# Patient Record
Sex: Male | Born: 1980 | Race: White | Hispanic: No | Marital: Married | Smoking: Never smoker
Health system: Southern US, Academic
[De-identification: ages and names within clinical notes are randomized; demographics above are authoritative.]

## PROBLEM LIST (undated history)

## (undated) DIAGNOSIS — K219 Gastro-esophageal reflux disease without esophagitis: Secondary | ICD-10-CM

## (undated) DIAGNOSIS — R000 Tachycardia, unspecified: Secondary | ICD-10-CM

## (undated) DIAGNOSIS — J309 Allergic rhinitis, unspecified: Secondary | ICD-10-CM

## (undated) DIAGNOSIS — U071 COVID-19: Secondary | ICD-10-CM

## (undated) DIAGNOSIS — F419 Anxiety disorder, unspecified: Secondary | ICD-10-CM

## (undated) DIAGNOSIS — G4719 Other hypersomnia: Secondary | ICD-10-CM

## (undated) DIAGNOSIS — I10 Essential (primary) hypertension: Secondary | ICD-10-CM

## (undated) HISTORY — PX: HX WISDOM TEETH EXTRACTION: SHX21

## (undated) HISTORY — DX: COVID-19: U07.1

## (undated) HISTORY — DX: Gastro-esophageal reflux disease without esophagitis: K21.9

## (undated) HISTORY — DX: Tachycardia, unspecified: R00.0

## (undated) HISTORY — DX: Anxiety disorder, unspecified: F41.9

## (undated) HISTORY — DX: Allergic rhinitis, unspecified: J30.9

## (undated) HISTORY — DX: Other hypersomnia: G47.19

## (undated) HISTORY — DX: Essential (primary) hypertension: I10

## (undated) HISTORY — PX: DENTAL SURGERY: SHX609

---
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
## 2021-01-31 DIAGNOSIS — G4700 Insomnia, unspecified: Secondary | ICD-10-CM | POA: Insufficient documentation

## 2021-04-14 ENCOUNTER — Ambulatory Visit (INDEPENDENT_AMBULATORY_CARE_PROVIDER_SITE_OTHER): Payer: Self-pay | Admitting: Surgery

## 2021-04-19 ENCOUNTER — Encounter (RURAL_HEALTH_CENTER): Payer: Self-pay | Admitting: INTERNAL MEDICINE

## 2021-04-19 ENCOUNTER — Encounter (HOSPITAL_COMMUNITY): Payer: Self-pay | Admitting: INTERNAL MEDICINE

## 2021-04-19 DIAGNOSIS — E559 Vitamin D deficiency, unspecified: Secondary | ICD-10-CM | POA: Insufficient documentation

## 2021-04-19 DIAGNOSIS — R739 Hyperglycemia, unspecified: Secondary | ICD-10-CM | POA: Insufficient documentation

## 2021-04-19 DIAGNOSIS — K219 Gastro-esophageal reflux disease without esophagitis: Secondary | ICD-10-CM | POA: Insufficient documentation

## 2021-04-19 DIAGNOSIS — E291 Testicular hypofunction: Secondary | ICD-10-CM

## 2021-04-19 DIAGNOSIS — E781 Pure hyperglyceridemia: Secondary | ICD-10-CM | POA: Insufficient documentation

## 2021-04-19 DIAGNOSIS — G43909 Migraine, unspecified, not intractable, without status migrainosus: Secondary | ICD-10-CM | POA: Insufficient documentation

## 2021-04-19 DIAGNOSIS — F419 Anxiety disorder, unspecified: Secondary | ICD-10-CM | POA: Insufficient documentation

## 2021-04-19 DIAGNOSIS — J9601 Acute respiratory failure with hypoxia: Secondary | ICD-10-CM | POA: Insufficient documentation

## 2021-04-19 DIAGNOSIS — I10 Essential (primary) hypertension: Secondary | ICD-10-CM | POA: Insufficient documentation

## 2021-04-19 DIAGNOSIS — D751 Secondary polycythemia: Secondary | ICD-10-CM | POA: Insufficient documentation

## 2021-04-20 ENCOUNTER — Encounter (INDEPENDENT_AMBULATORY_CARE_PROVIDER_SITE_OTHER): Payer: Self-pay | Admitting: Surgery

## 2021-04-20 ENCOUNTER — Other Ambulatory Visit: Payer: Self-pay

## 2021-04-20 ENCOUNTER — Ambulatory Visit (INDEPENDENT_AMBULATORY_CARE_PROVIDER_SITE_OTHER): Payer: Managed Care, Other (non HMO) | Admitting: Surgery

## 2021-04-20 VITALS — BP 113/79 | HR 85 | Temp 97.6°F | Resp 18 | Ht 68.0 in | Wt 373.5 lb

## 2021-04-20 DIAGNOSIS — L989 Disorder of the skin and subcutaneous tissue, unspecified: Secondary | ICD-10-CM

## 2021-04-22 ENCOUNTER — Encounter (RURAL_HEALTH_CENTER): Payer: Self-pay | Admitting: INTERNAL MEDICINE

## 2021-04-22 ENCOUNTER — Other Ambulatory Visit: Payer: Self-pay

## 2021-04-22 ENCOUNTER — Ambulatory Visit: Payer: Managed Care, Other (non HMO) | Attending: INTERNAL MEDICINE | Admitting: INTERNAL MEDICINE

## 2021-04-22 ENCOUNTER — Ambulatory Visit (RURAL_HEALTH_CENTER): Payer: Managed Care, Other (non HMO) | Attending: INTERNAL MEDICINE | Admitting: INTERNAL MEDICINE

## 2021-04-22 VITALS — BP 121/83 | HR 64 | Resp 18 | Ht 68.0 in | Wt 268.0 lb

## 2021-04-22 DIAGNOSIS — I10 Essential (primary) hypertension: Secondary | ICD-10-CM | POA: Insufficient documentation

## 2021-04-22 DIAGNOSIS — R600 Localized edema: Secondary | ICD-10-CM | POA: Insufficient documentation

## 2021-04-22 DIAGNOSIS — E538 Deficiency of other specified B group vitamins: Secondary | ICD-10-CM | POA: Insufficient documentation

## 2021-04-22 DIAGNOSIS — R0600 Dyspnea, unspecified: Secondary | ICD-10-CM | POA: Insufficient documentation

## 2021-04-22 DIAGNOSIS — R739 Hyperglycemia, unspecified: Secondary | ICD-10-CM | POA: Insufficient documentation

## 2021-04-22 DIAGNOSIS — E291 Testicular hypofunction: Secondary | ICD-10-CM | POA: Insufficient documentation

## 2021-04-22 DIAGNOSIS — F419 Anxiety disorder, unspecified: Secondary | ICD-10-CM | POA: Insufficient documentation

## 2021-04-22 DIAGNOSIS — G629 Polyneuropathy, unspecified: Secondary | ICD-10-CM | POA: Insufficient documentation

## 2021-04-22 DIAGNOSIS — E781 Pure hyperglyceridemia: Secondary | ICD-10-CM | POA: Insufficient documentation

## 2021-04-22 DIAGNOSIS — E559 Vitamin D deficiency, unspecified: Secondary | ICD-10-CM | POA: Insufficient documentation

## 2021-04-22 DIAGNOSIS — K219 Gastro-esophageal reflux disease without esophagitis: Secondary | ICD-10-CM | POA: Insufficient documentation

## 2021-04-22 DIAGNOSIS — F325 Major depressive disorder, single episode, in full remission: Secondary | ICD-10-CM | POA: Insufficient documentation

## 2021-04-22 LAB — VITAMIN B12: VITAMIN B 12: 294 pg/mL (ref 180–914)

## 2021-04-22 LAB — LIPID PANEL
CHOL/HDL RATIO: 4.5
CHOLESTEROL: 141 mg/dL (ref 136–290)
HDL CHOL: 31 mg/dL (ref 23–92)
LDL CALC: 79 mg/dL (ref 0–100)
TRIGLYCERIDES: 153 mg/dL — ABNORMAL HIGH (ref <=150)
VLDL CALC: 31 mg/dL (ref 0–50)

## 2021-04-22 LAB — CBC WITH DIFF
BASOPHIL #: 0 10*3/uL (ref 0.00–2.50)
BASOPHIL %: 0 % (ref 0–3)
EOSINOPHIL #: 0.1 10*3/uL (ref 0.00–2.40)
EOSINOPHIL %: 1 % (ref 0–7)
HCT: 43.8 % (ref 42.0–51.0)
HGB: 14.9 g/dL (ref 13.5–18.0)
LYMPHOCYTE #: 1.5 10*3/uL — ABNORMAL LOW (ref 2.10–11.00)
LYMPHOCYTE %: 21 % — ABNORMAL LOW (ref 25–45)
MCH: 29 pg (ref 27.0–32.0)
MCHC: 34.1 g/dL (ref 32.0–36.0)
MCV: 85.1 fL (ref 78.0–99.0)
MONOCYTE #: 0.6 10*3/uL (ref 0.00–4.10)
MONOCYTE %: 9 % (ref 0–12)
MPV: 8 fL (ref 7.4–10.4)
NEUTROPHIL #: 5.2 10*3/uL (ref 4.10–29.00)
NEUTROPHIL %: 69 % (ref 40–76)
PLATELETS: 258 10*3/uL (ref 140–440)
RBC: 5.15 10*6/uL (ref 4.20–6.00)
RDW: 14.7 % (ref 11.6–14.8)
WBC: 7.4 10*3/uL (ref 4.0–10.5)
WBCS UNCORRECTED: 7.4 10*3/uL

## 2021-04-22 LAB — COMPREHENSIVE METABOLIC PNL, FASTING
ALBUMIN/GLOBULIN RATIO: 1.7 — ABNORMAL HIGH (ref 0.8–1.4)
ALBUMIN: 4.2 g/dL (ref 3.5–5.7)
ALKALINE PHOSPHATASE: 74 U/L (ref 34–104)
ALT (SGPT): 36 U/L (ref 7–52)
ANION GAP: 4 mmol/L — ABNORMAL LOW (ref 10–20)
AST (SGOT): 19 U/L (ref 13–39)
BILIRUBIN TOTAL: 0.5 mg/dL (ref 0.3–1.2)
BUN/CREA RATIO: 21 (ref 6–22)
BUN: 19 mg/dL (ref 7–25)
CALCIUM, CORRECTED: 9.1 mg/dL (ref 8.9–10.8)
CALCIUM: 9.3 mg/dL (ref 8.6–10.3)
CHLORIDE: 106 mmol/L (ref 98–107)
CO2 TOTAL: 29 mmol/L (ref 21–31)
CREATININE: 0.9 mg/dL (ref 0.60–1.30)
ESTIMATED GFR: 112 mL/min/{1.73_m2} (ref >59)
GLOBULIN: 2.5 — ABNORMAL LOW (ref 2.9–5.4)
GLUCOSE: 101 mg/dL (ref 74–109)
OSMOLALITY, CALCULATED: 280 mOsm/kg (ref 270–290)
POTASSIUM: 4.8 mmol/L (ref 3.5–5.1)
PROTEIN TOTAL: 6.7 g/dL (ref 6.4–8.9)
SODIUM: 139 mmol/L (ref 136–145)

## 2021-04-22 LAB — VITAMIN D 25 TOTAL: VITAMIN D: 26 ng/mL — ABNORMAL LOW (ref 30–100)

## 2021-04-22 MED ORDER — TRAZODONE 100 MG TABLET
100.0000 mg | ORAL_TABLET | Freq: Every evening | ORAL | 3 refills | Status: DC
Start: 2021-04-22 — End: 2021-10-13

## 2021-04-22 MED ORDER — OMEPRAZOLE 40 MG CAPSULE,DELAYED RELEASE
40.0000 mg | DELAYED_RELEASE_CAPSULE | Freq: Every day | ORAL | 3 refills | Status: DC
Start: 2021-04-22 — End: 2021-10-13

## 2021-04-22 NOTE — Progress Notes (Signed)
[facility]  [address]  [place], [place]  [address]  Phone: [phone] Fax: [phone]    Name: Aaron Skinner                       Date of Birth: September 09, 1980   MRN:  [id number]                         Date of visit: 04/22/2021     Chief Complaint: Follow Up 3 Months (No new problems)    Current Outpatient Medications   Medication Sig   . hydrOXYzine pamoate (VISTARIL) 25 mg Oral Capsule Take 1 Capsule (25 mg total) by mouth Three times a day as needed for Itching   . omeprazole (PRILOSEC) 40 mg Oral Capsule, Delayed Release(E.C.) Take 1 Capsule (40 mg total) by mouth Once a day   . pediatric multivitamins Oral Tablet, Chewable Chew 1 Tablet Once a day   . rizatriptan (MAXALT) 10 mg Oral Tablet Take 1 Tablet (10 mg total) by mouth Once, as needed for Migraine May repeat in 2 hours if needed.   . traZODone (DESYREL) 100 mg Oral Tablet Take 1 Tablet (100 mg total) by mouth Every night       Patient Active Problem List    Diagnosis Date Noted   . Neuropathy (CMS HCC) 04/22/2021   . B12 deficiency 04/22/2021   . Localized edema 04/22/2021   . 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 Hyperglycemia 04/19/2021   . Migraine 04/19/2021   . Anxiety 04/19/2021   . Hypertension 04/19/2021   . Erythrocytosis 04/19/2021   . Vitamin D deficiency 04/19/2021   . Gastroesophageal reflux disease 04/19/2021   . Acute respiratory failure with hypoxia (CMS HCC) 04/19/2021       Subjective:   Patient is here for CDM visit.    Obesity  Lost 25# since last visit. (I believe 373 in vitals is probably an error)    Neuropathy  Presenting as numbness and tingling in B/L heels. Present since 12-2019.  Patient was noted to have low normal B12 by myself 06-23-20 with level 287.  Suggested for patient to pick up OTC replacement as insurance does not cover  This resolved sx. Will remove at next visit.     B12 deficiency  Causing peripheral  neuropathy  On OTC replacement  Labs on 06-23-2020 showed vitamin B12 287    Hyperglycemia  Labs on 05-2019 showed hemoglobin A1c 5.8  Labs on 06-23-2020 showed hemoglobin A1c 5.5  Labs on 01/18/2021 showed hemoglobin A1c 5.6    Edema  Not currently taking lasix    GERD  On Omeprazole  Still having symptoms    Hypertension  Was on Lisinopril  Greatly improved with the weight loss    Anxiety  Was on ativan, vistaril and buspar  Review of records note patient does not believe BuSpar works well  on vistaril, just taking PRN    Depression  On trazodone, just taking PRN  Review of records notes patient has received Wellbutrin, Lexapro, Effexor, Depakote, Abilify among others    Hypogonadism  On testosterone  I could not find where any work-up on testosterone levels was ever performed by last PCP. 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 Endo had visit, and provided simple replacement without any further evaluation.  Patient called [place]  [phone] requesting we write Rx again. Stated insurance does not cover if Endocrine prescribed.   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 Normal Echo. Normal PFTs.  Released by cardiology 10-2019  Dyspnea mostly resolved.    Vitamin D Deficiency  On replacement  Labs on 06-23-2020 showed vitamin  D 33.7    ROS:  10 systems reviewed and were negative except as noted.   + fatigue  + numbness  + insomnia, anxiety, depression    Objective :  BP 121/83 (Site: Left, Patient Position: Sitting, Cuff Size: Adult)   Pulse 64   Resp 18   Ht 1.727 m (5\' 8" )   Wt 122 kg (268 lb)   SpO2 95%   BMI 40.75 kg/m       General:  appears in good health and morbidly obese  Lungs:  Clear to auscultation bilaterally.   Cardiovascular:  regular rate and rhythm  Neurologic:  Grossly normal  Psychiatric:  Normal    Data reviewed:      Assessment/Plan:  Assessment/Plan   1. Neuropathy (CMS HCC)    2. B12 deficiency    3. Hyperglycemia    4. Localized edema    5. Gastroesophageal reflux disease, unspecified whether esophagitis present    6. Hypertension, unspecified type    7. Anxiety    8. 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)    9. Male hypogonadism    10. Pure hyperglyceridemia    11. Vitamin D deficiency      Hypertension  Monitor  Fairly good control    GERD  Continue omeprazole  behavioral changes discussed    Dyspnea  History of asthma with worsening from COVID-pneumonia  Continue inhalers    Psych  Continue Vistaril as needed    Vitamin D deficiency  Continue replacement    Edema  Strongly suspect due to patient's weight  improved    Neuropathy  Suspect 2/2 B12 deficiency  Resolved    Hyperglycemia  Monitor    Hypertriglyceridemia  Monitor  Not really high enough for medical treatment    B12 deficiency  Continue Replacement    Hypogonadism  Repeat labs  Consider Clomid instead of injection    Moderate decision making    Casimer Lanius, DO, [facility]  Internal Medicine

## 2021-04-22 NOTE — Nursing Note (Signed)
Patient is here for his follow up. Patient reports he takes all of his medications only as needed.

## 2021-04-23 ENCOUNTER — Encounter (INDEPENDENT_AMBULATORY_CARE_PROVIDER_SITE_OTHER): Payer: Self-pay | Admitting: Surgery

## 2021-04-23 LAB — HGA1C (HEMOGLOBIN A1C WITH EST AVG GLUCOSE): HEMOGLOBIN A1C: 5.2 % (ref 4.0–6.0)

## 2021-04-23 NOTE — Progress Notes (Signed)
Office History and Physical      Reason for Visit: Other (Painful "knots" posterior right upper arm)    History of Present Illness  Mr. Aaron Skinner presents as a referral by Dr. Baldomero Lamy for evaluation of a posterior right arm lesion.  It has been present for the last 3-4 weeks.  It has increased in size initially now almost completely resolved.  Etiology is unknown.  Describes possible thrombophlebitis of the area.  Seems like it was worse when he was "working out".  Is located to the posterior aspect of the triceps.  No history of similar occurrences in the past.  No complaints currently.  Asymptomatic.      I have reviewed the patient's provided medical records and diagnostic testing including laboratory values, imaging results, documented encounters and providers notes with all pertinent information noted with respect to today's evaluation serving as unique tests and sources as a component of the medical decision making process for this encounter relevant to the patients independent evaluation by me today.        Patient Data  Patient History  Past Medical History:   Diagnosis Date   . Allergic rhinitis    . Anxiety    . COVID    . Esophageal reflux    . Excessive daytime sleepiness    . Hypertension     does not have anymore   . Tachycardia, unspecified          Past Surgical History:   Procedure Laterality Date   . DENTAL SURGERY     . HX WISDOM TEETH EXTRACTION           Current Outpatient Medications   Medication Sig   . hydrOXYzine pamoate (VISTARIL) 25 mg Oral Capsule Take 1 Capsule (25 mg total) by mouth Three times a day as needed for Itching   . omeprazole (PRILOSEC) 40 mg Oral Capsule, Delayed Release(E.C.) Take 1 Capsule (40 mg total) by mouth Once a day   . pediatric multivitamins Oral Tablet, Chewable Chew 1 Tablet Once a day   . rizatriptan (MAXALT) 10 mg Oral Tablet Take 1 Tablet (10 mg total) by mouth Once, as needed for Migraine May repeat in 2 hours if needed.   . traZODone (DESYREL)  100 mg Oral Tablet Take 1 Tablet (100 mg total) by mouth Every night     Allergies   Allergen Reactions   . Clindamycin      Family Medical History:    None         Social History     Tobacco Use   . Smoking status: Never   . Smokeless tobacco: Never   Vaping Use   . Vaping Use: Never used   Substance Use Topics   . Alcohol use: Not Currently     Comment: social drinker   . Drug use: Never        The above documented section regarding past medical, past surgical, family, and social history (PMFSH) has been reviewed and considered and to the best of my knowledge represents a valid and accurate reflection of the patient's previous pertinent experiences documented by multiple providers and participants of the EMR.I cannot attest to all entries but do no recognize any gross inaccuracies as the data is a common field across all providers  Further history pertinent to the current encounter will be found as referenced       Physical Examination:    Vitals:    04/20/21 1617   BP: 113/79  Pulse: 85   Resp: 18   Temp: 36.4 C (97.6 F)   SpO2: 93%   Weight: (!) 169 kg (373 lb 8 oz)   Height: 1.727 m (5\' 8" )   BMI: 56.91      General: appropriate for age. in no acute distress.    HEENT: Atraumatic, Normocephalic.    Lungs: Nonlabored breathing with symmetric expansion    Heart:Regular wth respect to rate and rythmn.    Abdomen:Soft. Nontender. Nondistended     Psychiatric: Alert and oriented to person, place, and time. affect appropriate    Skin: No rashes or obvious skin lesions .  No significant posterior arm subcutaneous lesion on exam today        Diagnosis:    ICD-10-CM    1. Lesion of subcutaneous tissue  L98.9           Plan:    Mr. Defelice has had essentially complete resolution of the subcutaneous nodule which was causing him discomfort previously.  No palpable mass.  No obvious area consistent with thrombophlebitis today.  Discussed options to include observation versus consideration of exploration versus ultrasound.   Given his current lack of symptoms as well as essentially resolution of the lesion he would like to avoid any further workup.  Follow up here as needed     This note may have been partially generated using MModal Fluency Direct system, and there may be some incorrect words, spellings, and punctuation that were not noted in checking the note before saving, though effort was made to avoid such errors.    Damian Leavell MD FACS RVT  [facility] -General Surgery

## 2021-04-26 LAB — LDL CHOLESTEROL, DIRECT: LDL DIRECT: 104 mg/dL — ABNORMAL HIGH (ref <100)

## 2021-05-01 LAB — TESTOSTERONE FREE (DIALYSIS) AND TOTAL,MS
TESTOSTERONE, FREE: 37.1 pg/mL (ref 35.0–155.0)
TESTOSTERONE,TOTAL,LC/MS/MS: 171 ng/dL — ABNORMAL LOW (ref 250–1100)

## 2021-05-02 ENCOUNTER — Telehealth (RURAL_HEALTH_CENTER): Payer: Self-pay | Admitting: INTERNAL MEDICINE

## 2021-05-02 MED ORDER — CLOMIPHENE CITRATE 50 MG TABLET
50.0000 mg | ORAL_TABLET | Freq: Every day | ORAL | 0 refills | Status: AC
Start: 2021-05-02 — End: 2021-07-31

## 2021-05-02 NOTE — Telephone Encounter (Signed)
Patient was notified of results he wants to know if you were going to send in any thing for Testerone or if you are gonna wait to see if he brings it up more

## 2021-05-02 NOTE — Telephone Encounter (Signed)
-----   Message from Terald Sleeper, DO sent at 05/02/2021  7:55 AM EDT -----  Testosterone finally back. Its higher than December, but still not in normal range.   B12 is low normal. May be worth OTC replacement.   Other labs all look ok  A1c was good at 5.2

## 2021-05-02 NOTE — Telephone Encounter (Signed)
Left message for pt to call regarding labs

## 2021-05-02 NOTE — Telephone Encounter (Signed)
Will try the Clomid since he didn't seem very excited about resuming the injections

## 2021-10-12 ENCOUNTER — Other Ambulatory Visit: Payer: Self-pay

## 2021-10-12 ENCOUNTER — Encounter (HOSPITAL_COMMUNITY): Payer: Self-pay | Admitting: Emergency Medicine

## 2021-10-12 ENCOUNTER — Emergency Department
Admission: EM | Admit: 2021-10-12 | Discharge: 2021-10-13 | Disposition: A | Discharge status: Home / Self Care | Payer: Managed Care, Other (non HMO) | Attending: Emergency Medicine | Admitting: Emergency Medicine

## 2021-10-12 DIAGNOSIS — R9431 Abnormal electrocardiogram [ECG] [EKG]: Secondary | ICD-10-CM | POA: Insufficient documentation

## 2021-10-12 DIAGNOSIS — I493 Ventricular premature depolarization: Secondary | ICD-10-CM | POA: Insufficient documentation

## 2021-10-12 LAB — CBC WITH DIFF
BASOPHIL #: 0 10*3/uL (ref 0.00–0.10)
BASOPHIL %: 1 % (ref 0–1)
EOSINOPHIL #: 0.1 10*3/uL (ref 0.00–0.50)
EOSINOPHIL %: 1 %
HCT: 46.5 % (ref 36.7–47.1)
HGB: 16 g/dL (ref 12.5–16.3)
LYMPHOCYTE #: 1.8 10*3/uL (ref 1.00–3.00)
LYMPHOCYTE %: 21 % (ref 16–44)
MCH: 29.6 pg (ref 23.8–33.4)
MCHC: 34.4 g/dL (ref 32.5–36.3)
MCV: 86.1 fL (ref 73.0–96.2)
MONOCYTE #: 0.7 10*3/uL (ref 0.30–1.00)
MONOCYTE %: 9 % (ref 5–13)
MPV: 8.5 fL (ref 7.4–11.4)
NEUTROPHIL #: 6 10*3/uL (ref 1.85–7.80)
NEUTROPHIL %: 69 % (ref 43–77)
PLATELETS: 267 10*3/uL (ref 140–440)
RBC: 5.4 10*6/uL (ref 4.06–5.63)
RDW: 14.3 % (ref 12.1–16.2)
WBC: 8.6 10*3/uL (ref 3.6–10.2)

## 2021-10-12 LAB — COMPREHENSIVE METABOLIC PANEL, NON-FASTING
ALBUMIN/GLOBULIN RATIO: 2.2 — ABNORMAL HIGH (ref 0.8–1.4)
ALBUMIN: 4.2 g/dL (ref 3.5–5.7)
ALKALINE PHOSPHATASE: 64 U/L (ref 34–104)
ALT (SGPT): 20 U/L (ref 7–52)
ANION GAP: 10 mmol/L (ref 4–13)
AST (SGOT): 17 U/L (ref 13–39)
BILIRUBIN TOTAL: 0.3 mg/dL (ref 0.3–1.2)
BUN/CREA RATIO: 20 (ref 6–22)
BUN: 21 mg/dL (ref 7–25)
CALCIUM, CORRECTED: 9.1 mg/dL (ref 8.9–10.8)
CALCIUM: 9.3 mg/dL (ref 8.6–10.3)
CHLORIDE: 106 mmol/L (ref 98–107)
CO2 TOTAL: 23 mmol/L (ref 21–31)
CREATININE: 1.07 mg/dL (ref 0.60–1.30)
ESTIMATED GFR: 91 mL/min/{1.73_m2} (ref >59)
GLOBULIN: 1.9 — ABNORMAL LOW (ref 2.9–5.4)
GLUCOSE: 94 mg/dL (ref 74–109)
OSMOLALITY, CALCULATED: 280 mOsm/kg (ref 270–290)
POTASSIUM: 3.8 mmol/L (ref 3.5–5.1)
PROTEIN TOTAL: 6.1 g/dL — ABNORMAL LOW (ref 6.4–8.9)
SODIUM: 139 mmol/L (ref 136–145)

## 2021-10-12 LAB — ECG 12 LEAD
Atrial Rate: 72 {beats}/min
Calculated P Axis: 6 degrees
Calculated R Axis: 61 degrees
Calculated T Axis: 27 degrees
PR Interval: 178 ms
QRS Duration: 88 ms
QT Interval: 398 ms
QTC Calculation: 435 ms
Ventricular rate: 72 {beats}/min

## 2021-10-12 LAB — TROPONIN-I
TROPONIN I: 3 ng/L (ref <20)
TROPONIN I: 3 ng/L (ref <20)

## 2021-10-12 LAB — THYROID STIMULATING HORMONE (SENSITIVE TSH): TSH: 2.389 u[IU]/mL (ref 0.450–5.330)

## 2021-10-12 NOTE — ED Triage Notes (Signed)
HEART PALPITATIONS x 4 DAYS. HAS TRIED TO LIMIT CAFFEINE AND CREATINE AND CELCIUS ENERGY DRINKS SINCE ONSET. NO KNOWN CARDIAC HX. STATES HEART RATE IS ELEVATED MORE AT NIGHT WHILE TRYING TO SLEEP.

## 2021-10-12 NOTE — ED Provider Notes (Signed)
[facility]  ED Primary Provider Note  History of Present Illness   chief complaint    Arrival: The patient arrived by Car         Aaron Skinner is a 41 y.o. male who had concerns including Palpitations.  Patient is a 41 year old male presents emergency room with a chief complaint of "palpitations".  Patient states that "I feel like my heart is skipping a be every few minutes".  He states he is not having any chest pain.  However "when my heart skips I feel like I have to take a breath".  Patient states he went to [place] [facility] who in turn sent him here to the emergency room.  Patient states he had been drinking energy drinks but it stopped him for the past several days.  In addition he is cut back on taken creatinine.  He states he is lost 85 lb over the past several months.  He denies any nicotine.  All nursing notes reviewed        Review of Systems     No other overt Review of Systems are noted to be positive except noted in the HPI.      Historical Data   History Reviewed This Encounter: Medical History  Surgical History  Family History  Social History      Physical Exam   ED Triage Vitals [10/12/21 2103]   BP (Non-Invasive) 136/77   Heart Rate 78   Respiratory Rate 18   Temperature 36.6 C (97.8 F)   SpO2 98 %   Weight 108 kg (238 lb)   Height 1.753 m ('5\' 9"'$ )         Exam:   Constitutional:  Patient alert orient x3 in no apparent distress.  No limitations.  Overweight  Head: Atraumatic normocephalic  Eyes :  Pupils are equal round reactive to light and accommodation extraocular muscles are intact.  Sclera and conjunctiva are unremarkable  Ears:  Tympanic membranes are pearly gray bilaterally; external auditory canals are unremarkable; external ears without any lesions  Nose:  Nares are patent turbinates are pink and moist  Mouth:  Mucosa is pink and moist without lesions.  Posterior pharynx is pink and moist without hypertrophy/exudate.  Neck:  Soft and supple without  palpable lymphadenopathy.  Heart:  Regular rate and rhythm without audible murmur  Lungs:  Clear to auscultation bilaterally without any wheezing/rales/rhonchi  Abdomen:  Soft nontender without any rebound or guarding; positive bowel sounds throughout  Genitalia:  Deferred  Skin:  Warm and dry without lesions.  Normal skin turgor.  Brisk capillary refill distally  Extremities:  Good strenght bilaterally with full range of motion of upper and lower extremities.  Neuro:  Alert oriented x3.  Cranial nerves II-XII grossly intact as tested.  Excellent sensation distally over all dermatomes.  Psychiatric:  Patient cooperative, affect appropriate, insight and judgment good        Procedures      Patient Data     Labs Ordered/Reviewed   COMPREHENSIVE METABOLIC PANEL, NON-FASTING - Abnormal; Notable for the following components:       Result Value    PROTEIN TOTAL 6.1 (*)     ALBUMIN/GLOBULIN RATIO 2.2 (*)     GLOBULIN 1.9 (*)     All other components within normal limits    Narrative:     Estimated Glomerular Filtration Rate (eGFR) is calculated using the CKD-EPI (2021) equation, intended for patients 52 years of age and  older. If gender is not documented or "unknown", there will be no eGFR calculation.   TROPONIN-I - Normal   THYROID STIMULATING HORMONE (SENSITIVE TSH) - Normal   CBC WITH DIFF - Normal   CBC/DIFF    Narrative:     The following orders were created for panel order CBC/DIFF.  Procedure                               Abnormality         Status                     ---------                               -----------         ------                     CBC WITH DIFF[[id number]]                Normal              Final result                 Please view results for these tests on the individual orders.   TROPONIN-I   TROPONIN-I       No orders to display       Medical Decision Making          Medical Decision Making  Patient has been on telemetry he is had a rare PVC.  EKG was unremarkable.  Laboratory workup was  also unremarkable.  Normal CBC/CMP/TSH/troponin.  Patient be discharged home.  See discharge instructions for detailed    Amount and/or Complexity of Data Reviewed  Labs: ordered. Decision-making details documented in ED Course.  ECG/medicine tests: ordered and independent interpretation performed. Decision-making details documented in ED Course.    Critical Care  Total time providing critical care: 0 minutes      ED Course as of 10/12/21 2305   Wed Oct 12, 2021   2148 EKG shows normal sinus rhythm heart rate 72, normal axis, normal R-wave progression, no ectopy, no ischemia, normal PR/QRS interval.   2302 CBC, CMP, TSH, troponin are all negative.   2303 On telemetry patient has had a rare PVC.              Following the history, physical exam, and ED workup, the patient was deemed stable and suitable for discharge. The patient/caregiver was advised to return to the ED for any new or worsening symptoms. Discharge medications, and follow-up instructions were discussed with the patient/caregiver in detail, who verbalizes understanding. The patient/caregiver is in agreement and is comfortable with the plan of care.    Disposition: Discharged         Current Discharge Medication List        CONTINUE these medications - NO CHANGES were made during your visit.        Details   hydrOXYzine pamoate 25 mg Capsule  Commonly known as: VISTARIL   25 mg, Oral, 3 TIMES DAILY PRN  Refills: 0     omeprazole 40 mg Capsule, Delayed Release(E.C.)  Commonly known as: PRILOSEC   40 mg, Oral, DAILY  Qty: 90 Capsule  Refills: 3     pediatric multivitamins Tablet, Chewable   1 Tablet, Oral, DAILY  Refills: 0     rizatriptan 10 mg Tablet  Commonly known as: MAXALT   10 mg, Oral, ONCE PRN, May repeat in 2 hours if needed.  Refills: 0     traZODone 100 mg Tablet  Commonly known as: DESYREL   100 mg, Oral, NIGHTLY  Qty: 90 Tablet  Refills: 3            Follow up:   Casimer Lanius, DO  [address]  [place]  [id number]  [phone]    In 3 days  Follow-up with primary care provider for recheck in 2-3 days                 Clinical Impression   PVC's (premature ventricular contractions) (Primary)         Current Discharge Medication List          R.A. Baldwin [place], DO  Department of Emergency Medicine

## 2021-10-12 NOTE — ED APP Handoff Note (Signed)
[facility]  Emergency Department  Provider in Triage Note    Name: Aaron Skinner  Age: 41 y.o.  Gender: male     Subjective:   Aaron Skinner is a 41 y.o. male who presents with complaint of Palpitations  .  Palpitations worse at night 4 days, sob minor    Objective:   There were no vitals filed for this visit.   Focused Physical Exam shows palpitations    Assessment:  A medical screening exam was completed.  This patient is a 41 y.o. male with initial findings showing palpitations       Plan:  Please see initial orders and work-up below.  This is to be continued with full evaluation in the main Emergency Department.     No current facility-administered medications for this encounter.     No results found for this or any previous visit (from the past 24 hour(s)).     Lynann Beaver Labrandon Knoch, NP-C  10/12/2021, 21:02

## 2021-10-12 NOTE — Discharge Instructions (Signed)
Continue to avoid caffeine and creatinine.  Be sure to get plenty of rest  Follow-up with family doctor for recheck in 2-3 days  Return to emergency room for any chest pain, shortness of breath, or any concerns

## 2021-10-13 ENCOUNTER — Encounter (RURAL_HEALTH_CENTER): Payer: Self-pay | Admitting: INTERNAL MEDICINE

## 2021-10-13 ENCOUNTER — Other Ambulatory Visit: Payer: Self-pay

## 2021-10-13 ENCOUNTER — Ambulatory Visit (RURAL_HEALTH_CENTER): Payer: Managed Care, Other (non HMO) | Attending: INTERNAL MEDICINE | Admitting: INTERNAL MEDICINE

## 2021-10-13 VITALS — BP 114/67 | HR 73 | Resp 18 | Ht 69.0 in | Wt 238.0 lb

## 2021-10-13 DIAGNOSIS — R002 Palpitations: Secondary | ICD-10-CM | POA: Insufficient documentation

## 2021-10-13 DIAGNOSIS — I493 Ventricular premature depolarization: Secondary | ICD-10-CM | POA: Insufficient documentation

## 2021-10-13 MED ORDER — OMEPRAZOLE 40 MG CAPSULE,DELAYED RELEASE
40.0000 mg | DELAYED_RELEASE_CAPSULE | Freq: Every day | ORAL | 3 refills | Status: DC
Start: 2021-10-13 — End: 2021-12-30

## 2021-10-13 NOTE — Progress Notes (Signed)
[name]  [address]  [place], [place]  [address]  Phone: [phone] Fax: [phone]    Name: Aaron Skinner                       Date of Birth: May 15, 1980   MRN:  [id number]                         Date of visit: 10/13/2021     Chief Complaint: ED Follow-up (Heart palpitations)    Current Outpatient Medications   Medication Sig    omeprazole (PRILOSEC) 40 mg Oral Capsule, Delayed Release(E.C.) Take 1 Capsule (40 mg total) by mouth Once a day    pediatric multivitamins Oral Tablet, Chewable Chew 1 Tablet Once a day    rizatriptan (MAXALT) 10 mg Oral Tablet Take 1 Tablet (10 mg total) by mouth Once, as needed for Migraine May repeat in 2 hours if needed.       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     10-12-21  [name]  Dx: Palpitations, PVCs    Patient is a 41 year old male that presents for a follow up post-emergency room visit for palpitations that started on Saturday. Patient noted last night in the ED and today that his heart was "skipping every few minutes." Patient also notes that every few minutes he can "feel his pulse in his head." Patient states that these episodes are different from anxiety attacks that he was previously treated for with Buspirone. Yesterday afternoon patient was evaluated by [facility] on Wednesday who sent him to the ED.    Findings from ED:   EKG shows normal sinus rhythm heart rate 72, normal axis, normal R-wave  progression, no ectopy, no ischemia, normal PR/QRS interval.   CBC, CMP, TSH, troponin levels were all negative   On telemetry patient had rare PVC.     ED recommended patient follow up with Primary Care in 2-3 days.    Patient has been on a weight loss and body building journey for the past several months. Patient notes that he normally takes [employer] creatine powder and drinks a [employer] Drink as a pre-work out. Denies other pre-work out powders. States that recently he had to switch creatine powder to a new brand, [employer] signature creatine powder. Patient also notes taking [employer] BCAA supplementation. Patient's diet includes adequate animal, dairy protein, and vegetables. Patient admits to low water intake. Patient only drinks approximately 2 bottles of water a day (total 16 fluid oz). Notes that he does prefer seltzer waters.     Of other note: patient has stopped his trazodone and vistaril.  Dr Michelle Nasuti has him on Rybelsus for glucose and weight loss  and has again been providing his Testosterone. So long as patient received from  [employer] at clinic, patient does not have copay.     ROS:  10 systems reviewed and were negative except as noted.     Objective :  BP 114/67 (Site: Left, Patient Position: Sitting, Cuff Size: Adult)   Pulse 73   Resp 18   Ht 1.753 m (5\' 9" )   Wt 108 kg (238 lb)   SpO2 96%   BMI 35.15 kg/m       General:  appears in good health  Lungs:  Clear to auscultation bilaterally.   Cardiovascular:     tachycardia  Neurologic:  Grossly normal  Psychiatric:  Normal    Data reviewed:      Assessment/Plan:  Assessment/Plan   1. Palpitations    2. PVC (premature ventricular contraction)      Suspect primary etiology is new creatine powder.  Advised patient to dispose of new brand. Counseling provided to patient regarding proper use of creatine powder. Advised patient to not take the creatine powder and energy drinks at the same time. Discussed caffeine limits. Advised patient to  avoid use of any pre-workout supplementation. Discussed continuing following healthy diet and current workout regimen.     If symptoms continue to occur, will refer to Cardiology. Patient would prefer not to return to Dr .   [name], Aaron Skinner, [name]  [name]     History and findings independently assessed and verified by attending.     Written and Signed by  Aaron Skinner, Aaron Skinner  Medical Student, Aaron Skinner     [facility], Aaron Skinner  [name]

## 2021-10-13 NOTE — Nursing Note (Signed)
Patient is here with c/o palpitations  every couple of minutes all through the day. Patient denies any pain with the episodes but causes a " pulse " sensation in his head. Patient states he has no other symptoms.

## 2021-10-14 ENCOUNTER — Other Ambulatory Visit (RURAL_HEALTH_CENTER): Payer: Self-pay | Admitting: INTERNAL MEDICINE

## 2021-10-14 ENCOUNTER — Telehealth (RURAL_HEALTH_CENTER): Payer: Self-pay | Admitting: INTERNAL MEDICINE

## 2021-10-14 DIAGNOSIS — R002 Palpitations: Secondary | ICD-10-CM

## 2021-10-14 NOTE — Telephone Encounter (Signed)
Patient called with questions regarding his troponin level that was taken when he was at the ER. Patient had questions as to whether the 3 was on the high end because he said it was suppose to be under 4.   Per Dr. Allen Norris: Patient was informed that the level was normal, but if he was concerned, we could refer him to cardiology for further work up.  Patient said that he would like the referral to cardiology with Dr. Elesa Massed.

## 2021-12-07 ENCOUNTER — Ambulatory Visit: Payer: Managed Care, Other (non HMO) | Attending: INTERNAL MEDICINE | Admitting: INTERNAL MEDICINE

## 2021-12-07 ENCOUNTER — Ambulatory Visit (RURAL_HEALTH_CENTER): Payer: Managed Care, Other (non HMO) | Attending: INTERNAL MEDICINE | Admitting: INTERNAL MEDICINE

## 2021-12-07 ENCOUNTER — Encounter (RURAL_HEALTH_CENTER): Payer: Self-pay | Admitting: INTERNAL MEDICINE

## 2021-12-07 ENCOUNTER — Other Ambulatory Visit: Payer: Self-pay

## 2021-12-07 VITALS — HR 81 | Temp 100.0°F | Resp 20

## 2021-12-07 DIAGNOSIS — J069 Acute upper respiratory infection, unspecified: Secondary | ICD-10-CM | POA: Insufficient documentation

## 2021-12-07 DIAGNOSIS — Z20822 Contact with and (suspected) exposure to covid-19: Secondary | ICD-10-CM | POA: Insufficient documentation

## 2021-12-07 LAB — POCT RAPID COVID-19 & FLU (AMB ONLY)
COVID-19 AG: NEGATIVE
INFLUENZA TYPE A: NEGATIVE
INFLUENZA TYPE B: NEGATIVE

## 2021-12-07 LAB — COVID-19 ~~LOC~~ MOLECULAR LAB TESTING
INFLUENZA VIRUS TYPE A: NOT DETECTED
INFLUENZA VIRUS TYPE B: NOT DETECTED
RESPIRATORY SYNCTIAL VIRUS (RSV): DETECTED — AB
SARS-CoV-2: NOT DETECTED

## 2021-12-07 MED ORDER — METHYLPREDNISOLONE 4 MG TABLETS IN A DOSE PACK
ORAL_TABLET | ORAL | 0 refills | Status: DC
Start: 2021-12-07 — End: 2021-12-30

## 2021-12-07 MED ORDER — BENZONATATE 100 MG CAPSULE
100.0000 mg | ORAL_CAPSULE | Freq: Three times a day (TID) | ORAL | 0 refills | Status: DC
Start: 2021-12-07 — End: 2021-12-30

## 2021-12-07 NOTE — Progress Notes (Signed)
[facility]  [address]  [place], [place]  [address]  Phone: [phone] Fax: [phone]    Name: Aaron Skinner                       Date of Birth: Aug 13, 1980   MRN:  [id number]                         Date of visit: 12/07/2021     Chief Complaint: Feel Sick (C/o sore throat, cough with greenish phlegm, body aches x 3 days)    Current Outpatient Medications   Medication Sig    omeprazole (PRILOSEC) 40 mg Oral Capsule, Delayed Release(E.C.) Take 1 Capsule (40 mg total) by mouth Once a day    pediatric multivitamins Oral Tablet, Chewable Chew 1 Tablet Once a day    rizatriptan (MAXALT) 10 mg Oral Tablet Take 1 Tablet (10 mg total) by mouth Once, as needed for Migraine May repeat in 2 hours if needed.       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     Complaining of sore throat, sinus congestion, drainage, cough (clear to white) and body aches. Ongoing 3 days. Denies fever or chills.   Did contact telemedicine who provided him with Augmentin 875, which he has been taking without benefit.     ROS:  10 systems reviewed and were negative except as noted.     Objective :  Pulse 81   Temp 37.8 C (100 F) (Tympanic)   Resp 20   SpO2 94%       General:  appears in good health  Lungs:  Clear to auscultation bilaterally.   Cardiovascular:  regular rate and rhythm  Neurologic:  Grossly normal  Psychiatric:  Normal  Nasal mucosa edematous with clear rhinorrhea  Some  post nasal drainage    Data reviewed:      Assessment/Plan:  Assessment/Plan   1. Upper respiratory tract infection, unspecified type      COVID negative  Influenza negative  Center Score 0, 1%-2.5% chance of strep, no Abx or further testing recommended    Patient almost definitely has a viral illness. Abx will be of no benefit.  Supportive care recommended. Will send in cough medication.  Will send in steroids to see if we can improve symptoms  Otherwise push fluids and increase vitamin C.  Stop taking Abx.     Terald Sleeper, DO, [facility]  Internal Medicine

## 2021-12-07 NOTE — Nursing Note (Signed)
12/07/21 0800   Required: Location Test Performed At:   [facility], [address] [place] [address]   Rapid Flu   Time Performed [phone]   Rapid Flu A Result Negative   Rapid Flu B Result Negative   Initials TD   Rapid COVID negative.Marland KitchenMarland KitchenPCR was sent   Influenza Culture Sent Yes   [place]

## 2021-12-07 NOTE — Nursing Note (Signed)
Patient is here with c/o cough with greenish phlegm, sore throat, and body aches x 3 days. Patient denied any fever. Patient said that he went through "tele-doc" Sunday night and is taking Amoxicillin 850mg  BID x 7days.

## 2021-12-08 ENCOUNTER — Telehealth (RURAL_HEALTH_CENTER): Payer: Self-pay | Admitting: INTERNAL MEDICINE

## 2021-12-08 NOTE — Telephone Encounter (Signed)
Patient was notified of his results.

## 2021-12-08 NOTE — Telephone Encounter (Signed)
-----   Message from Casimer Lanius, DO sent at 12/08/2021  7:09 AM EST -----  We now know what Virus he has.  The panel at [facility] picked up RSV.  There is no specific treatment for it beyond what we have already given him. He just may want to watch exposure to others since it is pretty contagious.

## 2021-12-25 ENCOUNTER — Encounter (HOSPITAL_COMMUNITY): Payer: Self-pay

## 2021-12-25 ENCOUNTER — Emergency Department
Admission: EM | Admit: 2021-12-25 | Discharge: 2021-12-25 | Disposition: A | Discharge status: Home / Self Care | Payer: Managed Care, Other (non HMO) | Attending: Student in an Organized Health Care Education/Training Program | Admitting: Student in an Organized Health Care Education/Training Program

## 2021-12-25 ENCOUNTER — Other Ambulatory Visit: Payer: Self-pay

## 2021-12-25 ENCOUNTER — Inpatient Hospital Stay (HOSPITAL_COMMUNITY): Payer: Managed Care, Other (non HMO)

## 2021-12-25 DIAGNOSIS — Z711 Person with feared health complaint in whom no diagnosis is made: Secondary | ICD-10-CM | POA: Insufficient documentation

## 2021-12-25 DIAGNOSIS — F419 Anxiety disorder, unspecified: Secondary | ICD-10-CM | POA: Insufficient documentation

## 2021-12-25 DIAGNOSIS — J449 Chronic obstructive pulmonary disease, unspecified: Secondary | ICD-10-CM | POA: Insufficient documentation

## 2021-12-25 DIAGNOSIS — I10 Essential (primary) hypertension: Secondary | ICD-10-CM | POA: Insufficient documentation

## 2021-12-25 LAB — CBC WITH DIFF
BASOPHIL #: 0 10*3/uL (ref 0.00–0.10)
BASOPHIL %: 0 % (ref 0–1)
EOSINOPHIL #: 0.1 10*3/uL (ref 0.00–0.50)
EOSINOPHIL %: 2 %
HCT: 47.6 % — ABNORMAL HIGH (ref 36.7–47.1)
HGB: 16.2 g/dL (ref 12.5–16.3)
LYMPHOCYTE #: 1.4 10*3/uL (ref 1.00–3.00)
LYMPHOCYTE %: 21 % (ref 16–44)
MCH: 28.8 pg (ref 23.8–33.4)
MCHC: 34.1 g/dL (ref 32.5–36.3)
MCV: 84.2 fL (ref 73.0–96.2)
MONOCYTE #: 0.7 10*3/uL (ref 0.30–1.00)
MONOCYTE %: 11 % (ref 5–13)
MPV: 7.7 fL (ref 7.4–11.4)
NEUTROPHIL #: 4.4 10*3/uL (ref 1.85–7.80)
NEUTROPHIL %: 66 % (ref 43–77)
PLATELETS: 244 10*3/uL (ref 140–440)
RBC: 5.65 10*6/uL — ABNORMAL HIGH (ref 4.06–5.63)
RDW: 15.6 % (ref 12.1–16.2)
WBC: 6.8 10*3/uL (ref 3.6–10.2)

## 2021-12-25 LAB — COMPREHENSIVE METABOLIC PANEL, NON-FASTING
ALBUMIN/GLOBULIN RATIO: 1.7 — ABNORMAL HIGH (ref 0.8–1.4)
ALBUMIN: 4.4 g/dL (ref 3.5–5.7)
ALKALINE PHOSPHATASE: 60 U/L (ref 34–104)
ALT (SGPT): 29 U/L (ref 7–52)
ANION GAP: 8 mmol/L (ref 4–13)
AST (SGOT): 24 U/L (ref 13–39)
BILIRUBIN TOTAL: 0.6 mg/dL (ref 0.3–1.2)
BUN/CREA RATIO: 13 (ref 6–22)
BUN: 13 mg/dL (ref 7–25)
CALCIUM, CORRECTED: 8.6 mg/dL — ABNORMAL LOW (ref 8.9–10.8)
CALCIUM: 8.9 mg/dL (ref 8.6–10.3)
CHLORIDE: 104 mmol/L (ref 98–107)
CO2 TOTAL: 28 mmol/L (ref 21–31)
CREATININE: 1.03 mg/dL (ref 0.60–1.30)
ESTIMATED GFR: 95 mL/min/{1.73_m2} (ref >59)
GLOBULIN: 2.6 — ABNORMAL LOW (ref 2.9–5.4)
GLUCOSE: 98 mg/dL (ref 74–109)
OSMOLALITY, CALCULATED: 280 mOsm/kg (ref 270–290)
POTASSIUM: 3.9 mmol/L (ref 3.5–5.1)
PROTEIN TOTAL: 7 g/dL (ref 6.4–8.9)
SODIUM: 140 mmol/L (ref 136–145)

## 2021-12-25 LAB — PT/INR
INR: 1.05 (ref <=5.00)
PROTHROMBIN TIME: 12.2 seconds (ref 9.8–12.7)

## 2021-12-25 LAB — B-TYPE NATRIURETIC PEPTIDE (BNP),PLASMA: BNP: <25 pg/mL (ref 5–100)

## 2021-12-25 LAB — MAGNESIUM: MAGNESIUM: 1.9 mg/dL (ref 1.9–2.7)

## 2021-12-25 LAB — TROPONIN-I
TROPONIN I: 5 ng/L (ref <20)
TROPONIN I: 4 ng/L (ref <20)

## 2021-12-25 LAB — LACTIC ACID LEVEL W/ REFLEX FOR LEVEL >2.0: LACTIC ACID: 1.5 mmol/L (ref 0.5–2.2)

## 2021-12-25 LAB — THYROID STIMULATING HORMONE (SENSITIVE TSH): TSH: 1.462 u[IU]/mL (ref 0.450–5.330)

## 2021-12-25 LAB — GOLD TOP TUBE

## 2021-12-25 NOTE — ED APP Handoff Note (Signed)
[facility]  Emergency Department  Provider in Triage Note    Name: STANISLAW ACTON III  Age: 41 y.o.  Gender: male     Subjective:   ONA RATHERT III is a 41 y.o. male who presents with complaint of Palpitations  .  Patient here with c/o heart palpitations and hypertension for 2 days. Reports some dizziness but denies any syncope. Denies any chest pain, dyspnea. Denies any N/V    Objective:   Filed Vitals:    12/25/21 1703   BP: 135/83   Pulse: 80   Resp: 18   Temp: 36.9 C (98.5 F)   SpO2: 99%          Assessment:  A medical screening exam was completed.  This patient is a 41 y.o. male with initial findings showing blood pressure 135/83    Plan:  Please see initial orders and work-up below.  This is to be continued with full evaluation in the main Emergency Department.     No current facility-administered medications for this encounter.     No results found for this or any previous visit (from the past 24 hour(s)).     Sherlie Ban, FNP-BC  12/25/2021, 17:01

## 2021-12-25 NOTE — Discharge Instructions (Signed)
Follow up with your primary care provider for close ED follow-up and blood pressure management.  Resume home medications as previously prescribed as applicable.  Return emergency department for new or worsening symptoms that concern you.

## 2021-12-25 NOTE — ED Provider Notes (Signed)
[facility]  ED Primary Provider Note  History of Present Illness   Chief Complaint   Patient presents with    Palpitations     Aaron Skinner is a 41 y.o. male who had concerns including Palpitations.  Arrival: The patient arrived by Car    41 year old male arrives today via POV history of hypertension, COPD, anxiety, unspecified tachycardia complaining of episode of heart palpitations on Friday he would patient reports he was at his friend's house and states "my friends said something so funny that I started having palpitations until 5am".  Patient reports yesterday he felt relatively well denies any heart palpitations.  Patient does take his blood pressure daily and states today he was hypertensive with a blood pressure of 170s/100's which concerned him enough to come the emergency department.  Patient does state that he has had hypertension in the past stated he was put on lisinopril after COVID due to his hypertension but states due to lifestyle modification and weight loss he has been able to come off of his blood pressure medication.  Patient is not currently on blood pressure medication states his blood pressure normally runs 130s over 80s.  Patient denies any chest pain shortness of breath nausea vomiting diarrhea dyspnea with exertion fever chills sick exposures.      History Reviewed This Encounter:      Physical Exam   ED Triage Vitals [12/25/21 1703]   BP (Non-Invasive) 135/83   Heart Rate 80   Respiratory Rate 18   Temperature 36.9 C (98.5 F)   SpO2 99 %   Weight 112 kg (246 lb)   Height 1.753 m (_0 )     Physical Exam  Vitals and nursing note reviewed.   Constitutional:       General: He is not in acute distress.     Appearance: Normal appearance. He is obese. He is not ill-appearing.   HENT:      Head: Normocephalic and atraumatic.   Eyes:      Pupils: Pupils are equal, round, and reactive to light.   Cardiovascular:      Rate and Rhythm: Normal rate and regular  rhythm.      Pulses: Normal pulses.      Heart sounds: Normal heart sounds.   Pulmonary:      Effort: Pulmonary effort is normal.      Breath sounds: Normal breath sounds. No stridor.   Abdominal:      General: Bowel sounds are normal.      Palpations: Abdomen is soft.   Musculoskeletal:         General: Normal range of motion.   Skin:     General: Skin is warm.      Capillary Refill: Capillary refill takes less than 2 seconds.   Neurological:      General: No focal deficit present.      Mental Status: He is alert and oriented to person, place, and time. Mental status is at baseline.       Patient Data     Labs Ordered/Reviewed   COMPREHENSIVE METABOLIC PANEL, NON-FASTING - Abnormal; Notable for the following components:       Result Value    ALBUMIN/GLOBULIN RATIO 1.7 (*)     CALCIUM, CORRECTED 8.6 (*)     GLOBULIN 2.6 (*)     All other components within normal limits    Narrative:     Estimated Glomerular Filtration Rate (eGFR) is calculated  using the CKD-EPI (2021) equation, intended for patients 34 years of age and older. If gender is not documented or "unknown", there will be no eGFR calculation.   CBC WITH DIFF - Abnormal; Notable for the following components:    RBC 5.65 (*)     HCT 47.6 (*)     All other components within normal limits   B-TYPE NATRIURETIC PEPTIDE - Normal    Narrative:                                 Class 1: 101-250 pg/mL                              Class 2: 251-550 pg/mL                              Class 3: 551-900 pg/mL                              Class 4: >901 pg/mL     The [employer] Heart Association has developed a four-stage functional classification system for CHF that is based on a subjective interpretation of the severity of a patient's clinical signs and symptoms.    Class 1 - Patients have no limitations on physical activity and have no symptoms with ordinary physical activity.    Class 2 - Patients have a slight limitation of physical activity and have symptoms with ordinary  physical activity.    Class 3 - Patients have a marked limitation of physical activity and have symptoms with less than ordinary physical activity, but not at rest.    Class 4 - Patients are unable to perform any physical activity without discomfort.   LACTIC ACID LEVEL W/ REFLEX FOR LEVEL >2.0 - Normal   MAGNESIUM - Normal   PT/INR - Normal    Narrative:     INR OF 2.0-3.0  RECOMMENDED FOR: PROPHYLAXIS/TREATMENT OF VENEOUS THROMBOSIS, PULMONARY EMBOLISM, PREVENTION OF SYSTEMIC EMBOLISM FROM ATRIAL FIBRILATION, MYOCARDIAL INFARCTION.    INR OF 2.5-3.5  RECOMMENDED FOR MECHANICAL PROSTHETIC HEART VALVES, RECURRENT SYSTEMIC EMBOLISM, RECURRENT MYOCARDIAL INFARCTION.     THYROID STIMULATING HORMONE (SENSITIVE TSH) - Normal   TROPONIN-I - Normal   TROPONIN-I - Normal   CBC/DIFF    Narrative:     The following orders were created for panel order CBC/DIFF.  Procedure                               Abnormality         Status                     ---------                               -----------         ------                     CBC WITH DIFF[[id number]]                Abnormal            Final result  Please view results for these tests on the individual orders.   TROPONIN-I   EXTRA TUBES    Narrative:     The following orders were created for panel order EXTRA TUBES.  Procedure                               Abnormality         Status                     ---------                               -----------         ------                     GOLD TOP BWGY[[id number]]                                    In process                   Please view results for these tests on the individual orders.   GOLD TOP TUBE     XR CHEST PA AND LATERAL   Final Result by Edi, Radresults In (11/26 1735)   NO ACUTE FINDINGS.         Radiologist location ID: [place] [facility]        Medical Decision Making  Troponin x2 within normal limits.  EKG showed normal sinus rhythm at a rate of 82 PR interval 156  QTC 446 no ST elevation or depression noted.  Chest x-ray unremarkable for any acute findings.  TSH within normal limits.  Patient has reached mean asymptomatic while in the ED. Blood pressure has remained in the 130s over 80s.  No episodes of tachycardia noted.  This time patient was fit for discharge advised to follow up with his PCP for close ED follow-up as well as blood pressure monitoring.  Patient was given strict ED return precautions.  Patient was in agreement to the treatment care provided today.  All questions and concerns addressed                Clinical Impression   Hypertension, unspecified type (Primary)   Feared condition not demonstrated       Disposition: Discharged

## 2021-12-25 NOTE — ED Triage Notes (Addendum)
Palpitations x2 days and high blood pressure. Reports dizziness, nausea, and abd pain last night. Recent increase of prescription of testosterone.

## 2021-12-25 NOTE — ED Nurses Note (Signed)
Patient discharged home.  AVS reviewed with patient.  A written copy of the AVS and discharge instructions was given to the patient.  Questions sufficiently answered as needed.  Patient encouraged to follow up with PCP as indicated.  In the event of an emergency, patient instructed to call 911 or go to the nearest emergency room. Patient left department via ambulation.

## 2021-12-26 DIAGNOSIS — R002 Palpitations: Secondary | ICD-10-CM

## 2021-12-26 LAB — ECG 12 LEAD
Atrial Rate: 82 {beats}/min
Calculated P Axis: 45 degrees
Calculated R Axis: 35 degrees
Calculated T Axis: 24 degrees
PR Interval: 156 ms
QRS Duration: 80 ms
QT Interval: 382 ms
QTC Calculation: 446 ms
Ventricular rate: 82 {beats}/min

## 2021-12-30 ENCOUNTER — Encounter (RURAL_HEALTH_CENTER): Payer: Self-pay | Admitting: INTERNAL MEDICINE

## 2021-12-30 ENCOUNTER — Ambulatory Visit (RURAL_HEALTH_CENTER): Payer: Managed Care, Other (non HMO) | Attending: INTERNAL MEDICINE | Admitting: INTERNAL MEDICINE

## 2021-12-30 ENCOUNTER — Other Ambulatory Visit: Payer: Self-pay

## 2021-12-30 ENCOUNTER — Ambulatory Visit: Payer: Managed Care, Other (non HMO) | Attending: INTERNAL MEDICINE | Admitting: INTERNAL MEDICINE

## 2021-12-30 VITALS — BP 121/64 | HR 68 | Resp 18 | Ht 69.0 in | Wt 256.2 lb

## 2021-12-30 DIAGNOSIS — G629 Polyneuropathy, unspecified: Secondary | ICD-10-CM | POA: Insufficient documentation

## 2021-12-30 DIAGNOSIS — G43009 Migraine without aura, not intractable, without status migrainosus: Secondary | ICD-10-CM | POA: Insufficient documentation

## 2021-12-30 DIAGNOSIS — I10 Essential (primary) hypertension: Secondary | ICD-10-CM | POA: Insufficient documentation

## 2021-12-30 DIAGNOSIS — G4733 Obstructive sleep apnea (adult) (pediatric): Secondary | ICD-10-CM | POA: Insufficient documentation

## 2021-12-30 DIAGNOSIS — E291 Testicular hypofunction: Secondary | ICD-10-CM | POA: Insufficient documentation

## 2021-12-30 DIAGNOSIS — E781 Pure hyperglyceridemia: Secondary | ICD-10-CM | POA: Insufficient documentation

## 2021-12-30 DIAGNOSIS — K219 Gastro-esophageal reflux disease without esophagitis: Secondary | ICD-10-CM | POA: Insufficient documentation

## 2021-12-30 DIAGNOSIS — E559 Vitamin D deficiency, unspecified: Secondary | ICD-10-CM | POA: Insufficient documentation

## 2021-12-30 DIAGNOSIS — F325 Major depressive disorder, single episode, in full remission: Secondary | ICD-10-CM | POA: Insufficient documentation

## 2021-12-30 DIAGNOSIS — R739 Hyperglycemia, unspecified: Secondary | ICD-10-CM | POA: Insufficient documentation

## 2021-12-30 DIAGNOSIS — E538 Deficiency of other specified B group vitamins: Secondary | ICD-10-CM | POA: Insufficient documentation

## 2021-12-30 DIAGNOSIS — F419 Anxiety disorder, unspecified: Secondary | ICD-10-CM | POA: Insufficient documentation

## 2021-12-30 LAB — LIPID PANEL
CHOL/HDL RATIO: 3.8
CHOLESTEROL: 145 mg/dL (ref <200)
HDL CHOL: 38 mg/dL (ref 23–92)
LDL CALC: 87 mg/dL (ref 0–100)
TRIGLYCERIDES: 98 mg/dL (ref <=150)
VLDL CALC: 20 mg/dL (ref 0–50)

## 2021-12-30 LAB — VITAMIN B12: VITAMIN B 12: 448 pg/mL (ref 180–914)

## 2021-12-30 LAB — HGA1C (HEMOGLOBIN A1C WITH EST AVG GLUCOSE): HEMOGLOBIN A1C: 5.5 % (ref 4.0–6.0)

## 2021-12-30 MED ORDER — OMEPRAZOLE 40 MG CAPSULE,DELAYED RELEASE
40.0000 mg | DELAYED_RELEASE_CAPSULE | Freq: Every day | ORAL | 3 refills | Status: DC
Start: 2021-12-30 — End: 2022-08-28

## 2021-12-30 NOTE — Nursing Note (Signed)
Patient is here for his three month follow up. Patient reports he has several issues he needs to discuss today.

## 2021-12-30 NOTE — Progress Notes (Signed)
[facility]  [address]  [place], [place]  [address]  Phone: [phone] Fax: [phone]    Name: Aaron Skinner                       Date of Birth: 1981-01-21   MRN:  [id number]                         Date of visit: 12/30/2021     Chief Complaint: Follow Up 3 Months (Was seen in Er for palpitations)    Current Outpatient Medications   Medication Sig    Methylprednisolone (MEDROL DOSEPACK) 4 mg Oral Tablets, Dose Pack Take as instructed. (Patient not taking: Reported on 12/30/2021)    multivitamin with iron Oral Tablet Take 1 Tablet by mouth Once a day    omeprazole (PRILOSEC) 40 mg Oral Capsule, Delayed Release(E.C.) Take 1 Capsule (40 mg total) by mouth Once a day    pediatric multivitamins Oral Tablet, Chewable Chew 1 Tablet Once a day (Patient not taking: Reported on 12/30/2021)    rizatriptan (MAXALT) 10 mg Oral Tablet Take 1 Tablet (10 mg total) by mouth Once, as needed for Migraine May repeat in 2 hours if needed.    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    He has positive COVID exposure. Wife and Children both positive yesterday.     Obesity  Gained 18# since last visit.     B12 deficiency  Causing peripheral neuropathy  On OTC replacement  Labs on 06-23-2020 showed vitamin B12 287    Hyperglycemia  Labs on 05-2019 showed hemoglobin A1c 5.8  Labs on 06-23-2020 showed hemoglobin A1c  5.5  Labs on 01/18/2021 showed hemoglobin A1c 5.6    Edema  Not currently taking lasix    GERD  On Omeprazole  Still having symptoms    Hypertension  Was on Lisinopril  Greatly improved with the weight loss    Anxiety  Was on ativan, vistaril and buspar  Review of records note patient does not believe BuSpar works well    Depression  Not on treatment  Review of records notes patient has received Trazodone, Wellbutrin, Lexapro, Effexor, Depakote, Abilify among others    Hypogonadism  On testosterone  I could not find where any work-up on testosterone levels was ever performed by last PCP. 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 Endo had visit, and provided simple replacement without any further evaluation.  Patient called [place] 10-2020 requesting we write Rx again. Stated insurance does not cover if Endocrine prescribed.   [phone] Patient reports Dr Michelle Nasuti prescibing again. He can do buy and bill through that office.   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 Normal Echo. Normal PFTs.  Released by cardiology 10-2019  Dyspnea mostly resolved.    Vitamin D Deficiency  On replacement  Labs on 06-23-2020 showed vitamin D 33.7    OSA  Patient reporting Diagnosis and usage of CPAP.  Diagnosed by Dr Felton Clinton around 2021  Patient  does not know settings  Was using [employer] in [place], and needs new machine.  He has not used it recently because it was spitting water into the tubing    Palpitations  Ongoing about 3 months  Workup in hospital has been negative twice.  Our history notes patient using creatine powder, and caffeinated energy drinks before working out. He does not drink much water. Counseling was provided, but patient disagrees that his workout schedule could be contributing to his palpitations.   Patient wanted referred to Interventional Cardiology, but provider refused referral. Suggested to refer to general cardiology. Has apt later this month.   [id number] Back in ED for same symptoms. Workup negative again.     ROS:  10 systems reviewed and were negative except as noted.   + fatigue  + numbness  + insomnia, anxiety, depression    Objective :  BP 121/64 (Site: Right, Patient Position: Sitting, Cuff Size: Adult)   Pulse 68   Resp 18   Ht 1.753 m (5\' 9" )   Wt 116 kg (256 lb 4 oz)   SpO2 98%   BMI 37.84 kg/m       General:  appears in good health and morbidly obese  Lungs:  Clear to auscultation bilaterally.   Cardiovascular:  regular rate and rhythm  Neurologic:  Grossly normal  Psychiatric:  Normal    Data reviewed:      Assessment/Plan:  Assessment/Plan   1. Hypertension, unspecified type    2. Pure hyperglyceridemia    3. Hyperglycemia    4. B12 deficiency    5. Vitamin D deficiency    6. Migraine without aura and without status migrainosus, not intractable    7. Gastroesophageal reflux disease, unspecified whether esophagitis present    8. Neuropathy (CMS HCC)    9. Male hypogonadism    10. Anxiety    11. 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)        Hypertension  Monitor  Fairly good control    GERD  Continue omeprazole  behavioral changes discussed    Dyspnea  History of asthma with worsening from COVID-pneumonia  Continue inhalers    Psych  Continue Vistaril as needed    Vitamin D deficiency  Continue  replacement    Edema  Monitor    Hyperglycemia  Monitor    Hypertriglyceridemia  Monitor  Not really high enough for medical treatment    B12 deficiency  Continue Replacement    Hypogonadism  Follow with Endocrine    Palpitations  Refer to general cardiology    OSA  Will send order for autocpap to [facility] out of [facility]  Ph [phone]    Moderate decision making    Terald Sleeper, DO, [facility]  Internal Medicine

## 2021-12-31 LAB — LDL CHOLESTEROL, DIRECT: LDL DIRECT: 102 mg/dL — ABNORMAL HIGH (ref <100)

## 2022-01-02 ENCOUNTER — Telehealth (RURAL_HEALTH_CENTER): Payer: Self-pay | Admitting: INTERNAL MEDICINE

## 2022-01-02 NOTE — Telephone Encounter (Signed)
-----   Message from Casimer Lanius, DO sent at 01/02/2022  9:02 AM EST -----  Labs all about the same  No recommended changes

## 2022-01-02 NOTE — Telephone Encounter (Signed)
Pt was notified of results

## 2022-01-12 ENCOUNTER — Other Ambulatory Visit (RURAL_HEALTH_CENTER): Payer: Self-pay | Admitting: INTERNAL MEDICINE

## 2022-01-12 ENCOUNTER — Telehealth (RURAL_HEALTH_CENTER): Payer: Self-pay | Admitting: INTERNAL MEDICINE

## 2022-01-12 MED ORDER — RIZATRIPTAN 10 MG TABLET
10.0000 mg | ORAL_TABLET | Freq: Once | ORAL | 3 refills | Status: DC | PRN
Start: 2022-01-12 — End: 2022-08-28

## 2022-01-12 NOTE — Telephone Encounter (Signed)
Patient requesting refill on Maxalt. He said that he has been having a lot of headaches.

## 2022-01-16 ENCOUNTER — Ambulatory Visit (RURAL_HEALTH_CENTER): Payer: Self-pay | Admitting: INTERNAL MEDICINE

## 2022-04-07 IMAGING — MR MRI KNEE RT W/O CONTRAST
5 series · 40 of 40 positions shown · IV contrast (gadolinium)
Comparison: None available.

﻿EXAM:  96974   MRI KNEE RT W/O CONTRAST
INDICATION: 41-year-old with persistent lateral right knee pain. Sustained injury while running. Swelling. No history of previous surgery.
TECHNIQUE: Multiplanar, multisequential MRI of the right knee was performed without gadolinium contrast.

[Series 5: PD fat-sat · axial · right · 4.0mm · 0.53mm/px · z∈[-65,+66]mm · 8 of 30 slices shown (1 of 3)]
[im 1/30]
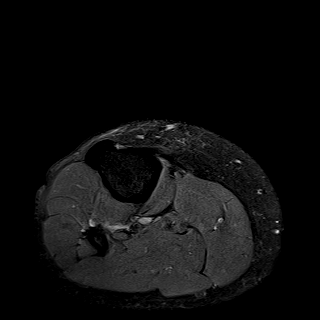
[im 5/30]
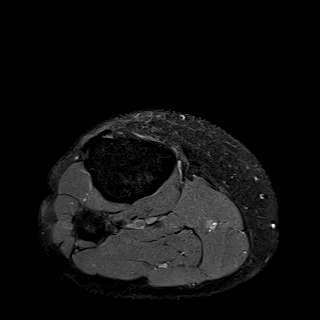
[im 9/30]
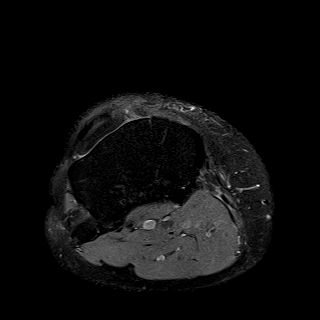
[im 13/30]
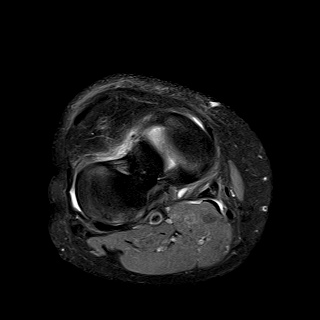
[im 17/30]
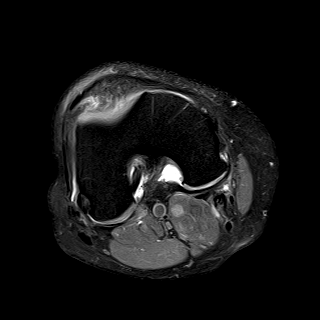
[im 21/30]
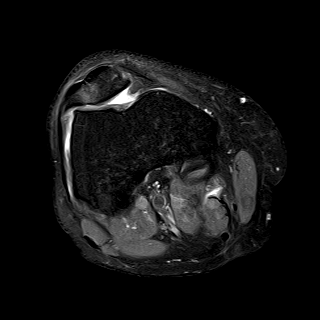
[im 25/30]
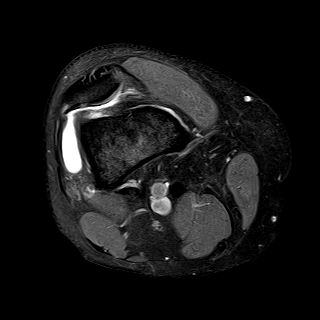
[im 30/30]
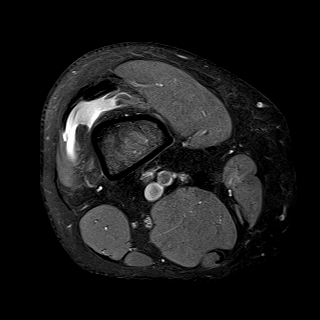

[Series 6: PD fat-sat · sagittal · right · 3.0mm · 0.47mm/px · 9 of 30 slices shown (2 of 3)]
[im 1/30]
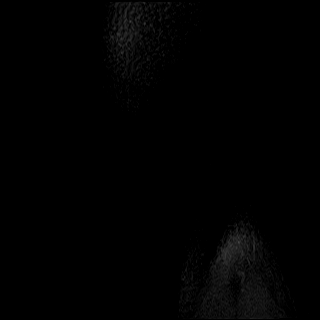
[im 4/30]
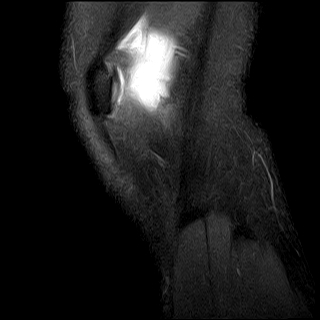
[im 8/30]
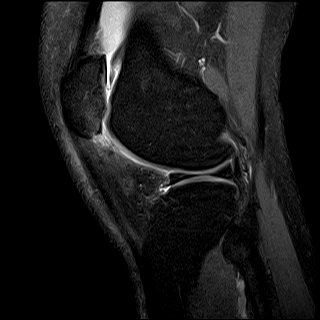
[im 11/30]
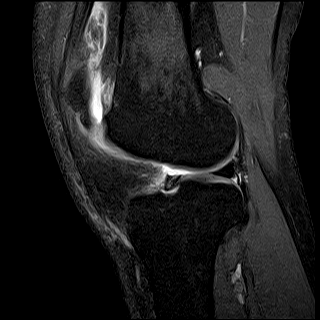
[im 15/30]
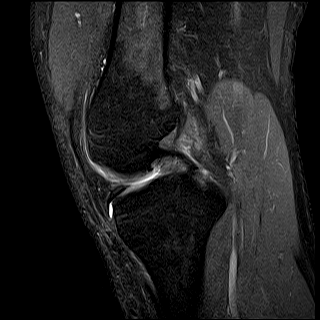
[im 19/30]
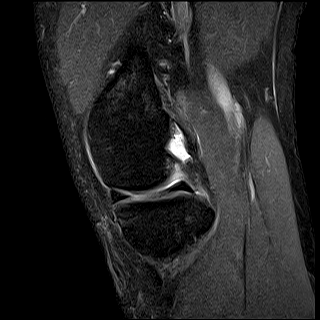
[im 22/30]
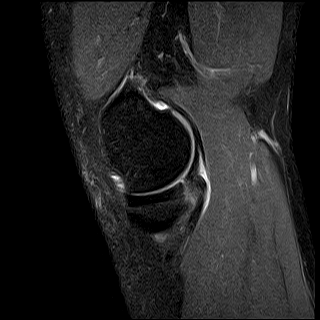
[im 26/30]
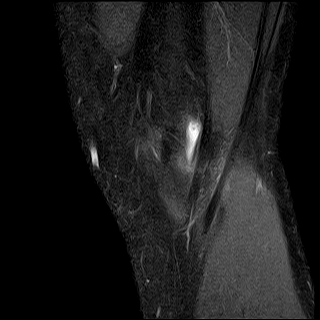
[im 30/30]
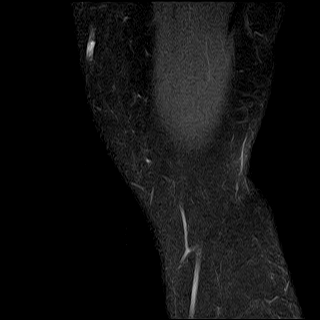

[Series 7: T1 · sagittal · right · 3.0mm · 0.39mm/px · 9 of 30 slices shown]
[im 1/30]
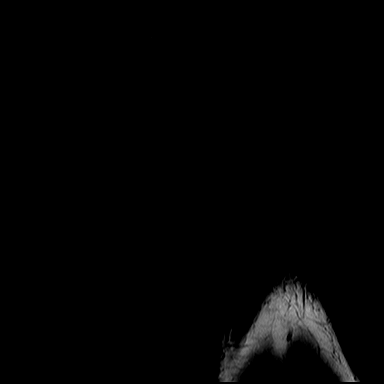
[im 4/30]
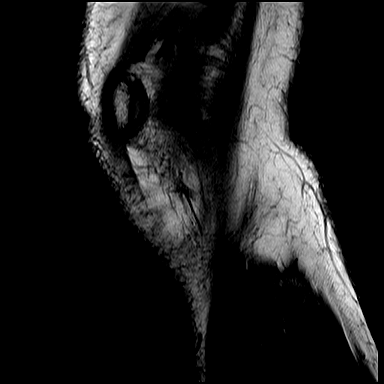
[im 8/30]
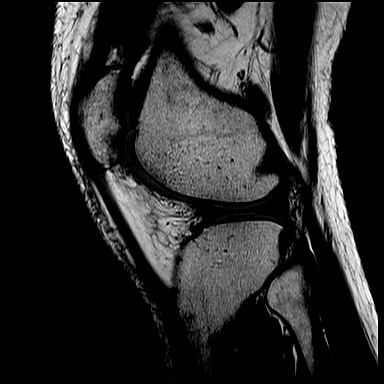
[im 11/30]
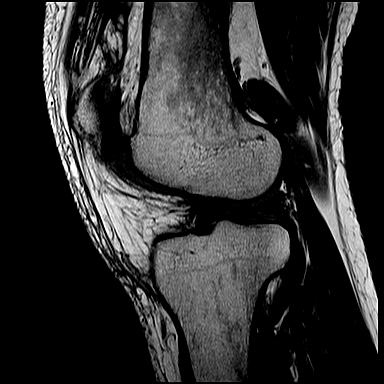
[im 15/30]
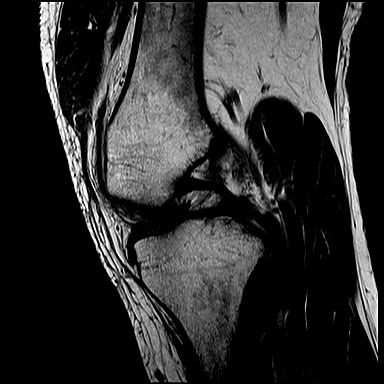
[im 19/30]
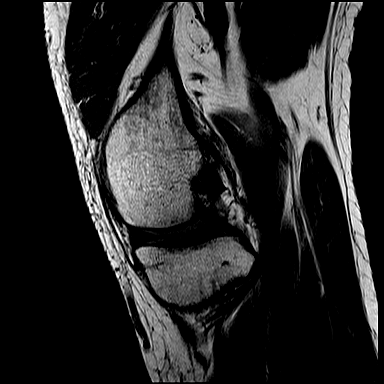
[im 22/30]
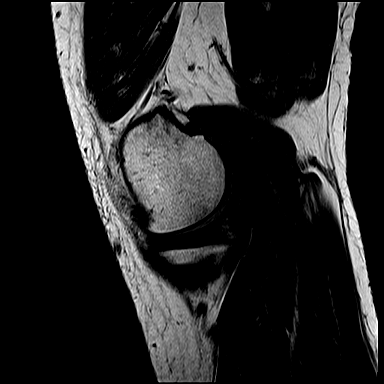
[im 26/30]
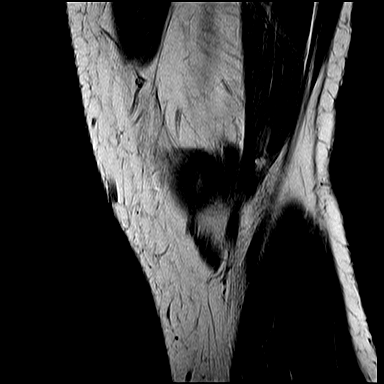
[im 30/30]
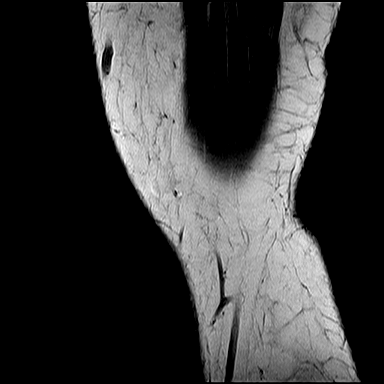

[Series 8: STIR · coronal · right · 3.5mm · 0.47mm/px · 7 of 22 slices shown]
[im 1/22]
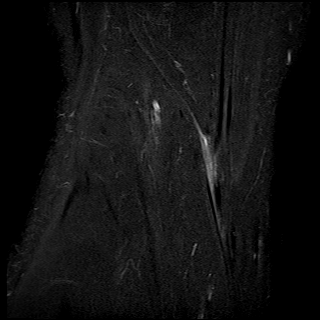
[im 4/22]
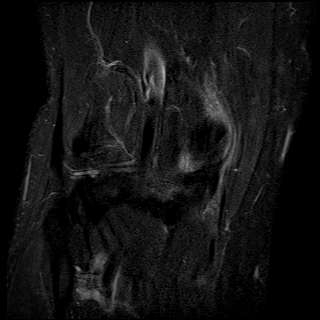
[im 8/22]
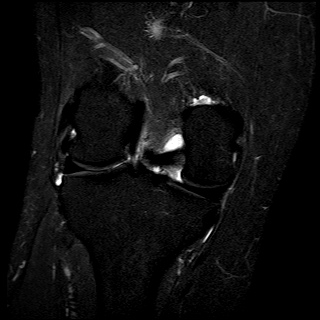
[im 11/22]
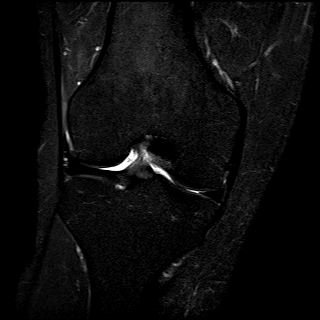
[im 15/22]
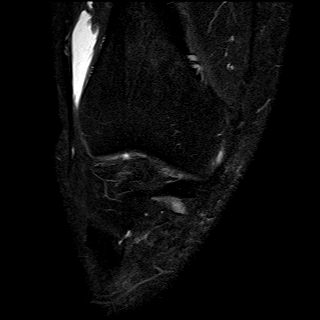
[im 18/22]
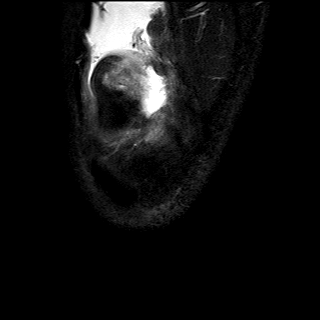
[im 22/22]
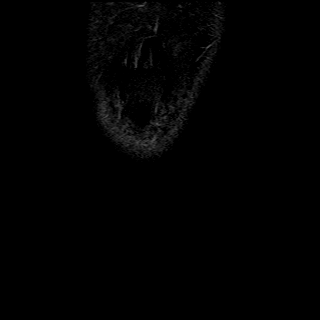

[Series 9: PD fat-sat · coronal · right · 3.5mm · 0.47mm/px · 7 of 22 slices shown (3 of 3)]
[im 1/22]
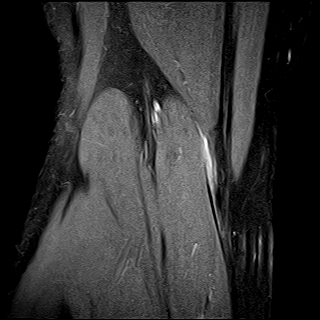
[im 4/22]
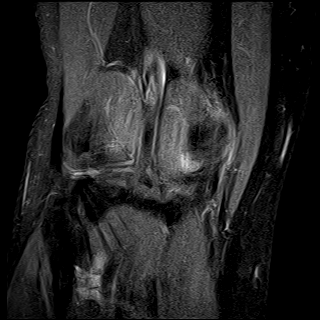
[im 8/22]
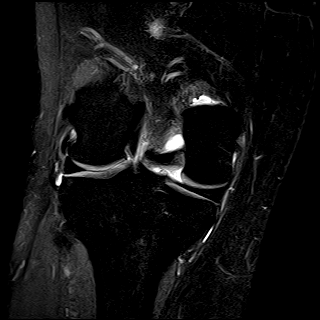
[im 11/22]
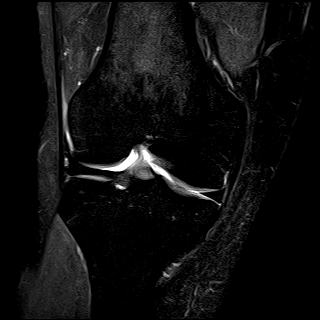
[im 15/22]
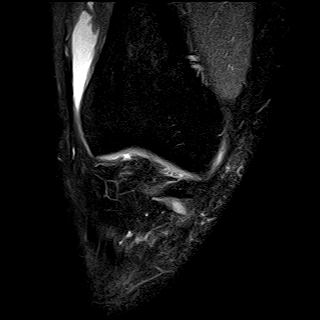
[im 18/22]
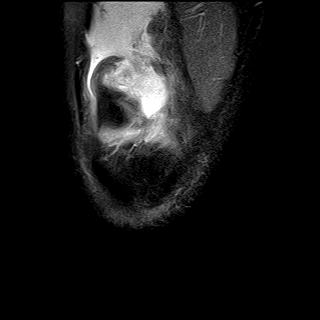
[im 22/22]
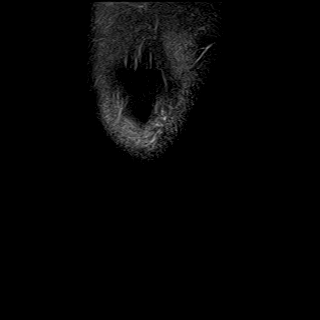

[40 of 40 positions shown; findings below may reference images not displayed]

FINDINGS: No acute bone changes are seen on both sides of the right knee joint.

The lateral meniscus and lateral articular cartilage do not show acute findings. 

The anterior and posterior cruciate ligaments are intact. 

Focal degenerative changes of the root of the medial meniscus are noted along with a small parameniscal cyst in this area, 6 mm in diameter suggestive of chronic tear and degenerative changes at the root of the medial meniscus.  No acute tear is seen. Minimal medial extrusion of mid medial meniscus is noted. 

Collateral ligaments are intact.  Quadriceps tendon and patellar tendon are intact.

Significant grade 4 chondromalacia of patellar articular cartilage is noted with subchondral edema of the lateral facet of the patellofemoral articulation.  Small effusion is noted in the knee joint.
IMPRESSION: 1. No acute bone changes at the right knee. 

2. Focal degenerative changes of the root of the medial meniscus is noted along with a small parameniscal cyst in this area, 6 mm in diameter suggestive of chronic tear and degenerative changes at the root of the medial meniscus.  No acute tear is seen. Minimal medial extrusion of mid medial meniscus is noted. 

3. Significant grade 4 chondromalacia of patellar articular cartilage is noted with subchondral edema of the lateral facet of the patellofemoral articulation.  Small effusion is noted in the knee joint.

## 2022-08-17 ENCOUNTER — Encounter (RURAL_HEALTH_CENTER): Payer: Self-pay | Admitting: INTERNAL MEDICINE

## 2022-08-17 ENCOUNTER — Telehealth (RURAL_HEALTH_CENTER): Payer: Self-pay | Admitting: INTERNAL MEDICINE

## 2022-08-17 NOTE — Telephone Encounter (Signed)
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

## 2022-08-17 NOTE — Telephone Encounter (Signed)
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 Advised pt will have information by Friday. Pt also made a follow up appt.

## 2022-08-28 ENCOUNTER — Other Ambulatory Visit: Payer: Self-pay

## 2022-08-28 ENCOUNTER — Encounter (RURAL_HEALTH_CENTER): Payer: Self-pay | Admitting: INTERNAL MEDICINE

## 2022-08-28 ENCOUNTER — Ambulatory Visit (RURAL_HEALTH_CENTER): Payer: Managed Care, Other (non HMO) | Attending: INTERNAL MEDICINE | Admitting: INTERNAL MEDICINE

## 2022-08-28 ENCOUNTER — Ambulatory Visit: Payer: Managed Care, Other (non HMO) | Attending: INTERNAL MEDICINE | Admitting: INTERNAL MEDICINE

## 2022-08-28 VITALS — BP 126/69 | HR 73 | Resp 18 | Ht 69.0 in | Wt 270.4 lb

## 2022-08-28 DIAGNOSIS — Z23 Encounter for immunization: Secondary | ICD-10-CM | POA: Insufficient documentation

## 2022-08-28 DIAGNOSIS — E538 Deficiency of other specified B group vitamins: Secondary | ICD-10-CM | POA: Insufficient documentation

## 2022-08-28 DIAGNOSIS — E781 Pure hyperglyceridemia: Secondary | ICD-10-CM | POA: Insufficient documentation

## 2022-08-28 DIAGNOSIS — F419 Anxiety disorder, unspecified: Secondary | ICD-10-CM | POA: Insufficient documentation

## 2022-08-28 DIAGNOSIS — I10 Essential (primary) hypertension: Secondary | ICD-10-CM | POA: Insufficient documentation

## 2022-08-28 DIAGNOSIS — K219 Gastro-esophageal reflux disease without esophagitis: Secondary | ICD-10-CM | POA: Insufficient documentation

## 2022-08-28 DIAGNOSIS — E559 Vitamin D deficiency, unspecified: Secondary | ICD-10-CM | POA: Insufficient documentation

## 2022-08-28 DIAGNOSIS — E291 Testicular hypofunction: Secondary | ICD-10-CM | POA: Insufficient documentation

## 2022-08-28 DIAGNOSIS — D751 Secondary polycythemia: Secondary | ICD-10-CM | POA: Insufficient documentation

## 2022-08-28 DIAGNOSIS — R739 Hyperglycemia, unspecified: Secondary | ICD-10-CM | POA: Insufficient documentation

## 2022-08-28 DIAGNOSIS — G629 Polyneuropathy, unspecified: Secondary | ICD-10-CM | POA: Insufficient documentation

## 2022-08-28 DIAGNOSIS — F325 Major depressive disorder, single episode, in full remission: Secondary | ICD-10-CM | POA: Insufficient documentation

## 2022-08-28 DIAGNOSIS — R600 Localized edema: Secondary | ICD-10-CM | POA: Insufficient documentation

## 2022-08-28 DIAGNOSIS — G43009 Migraine without aura, not intractable, without status migrainosus: Secondary | ICD-10-CM | POA: Insufficient documentation

## 2022-08-28 LAB — CBC WITH DIFF
BASOPHIL #: 0 10*3/uL (ref 0.00–0.10)
BASOPHIL %: 0 % (ref 0–1)
EOSINOPHIL #: 0.1 10*3/uL (ref 0.00–0.50)
EOSINOPHIL %: 2 % (ref 1–8)
HCT: 46.3 % (ref 36.7–47.1)
HGB: 15.6 g/dL (ref 12.5–16.3)
LYMPHOCYTE #: 1.4 10*3/uL (ref 1.00–3.00)
LYMPHOCYTE %: 25 % (ref 16–44)
MCH: 29.4 pg (ref 23.8–33.4)
MCHC: 33.7 g/dL (ref 32.5–36.3)
MCV: 87.1 fL (ref 73.0–96.2)
MONOCYTE #: 0.7 10*3/uL (ref 0.30–1.00)
MONOCYTE %: 12 % (ref 5–13)
MPV: 8.5 fL (ref 7.4–11.4)
NEUTROPHIL #: 3.6 10*3/uL (ref 1.85–7.80)
NEUTROPHIL %: 62 % (ref 43–77)
PLATELETS: 242 10*3/uL (ref 140–440)
RBC: 5.32 10*6/uL (ref 4.06–5.63)
RDW: 14.5 % (ref 12.1–16.2)
WBC: 5.9 10*3/uL (ref 3.6–10.2)

## 2022-08-28 LAB — COMPREHENSIVE METABOLIC PNL, FASTING
ALBUMIN/GLOBULIN RATIO: 1.7 — ABNORMAL HIGH (ref 0.8–1.4)
ALBUMIN: 4.3 g/dL (ref 3.5–5.7)
ALKALINE PHOSPHATASE: 57 U/L (ref 34–104)
ALT (SGPT): 37 U/L (ref 7–52)
ANION GAP: 10 mmol/L (ref 4–13)
AST (SGOT): 23 U/L (ref 13–39)
BILIRUBIN TOTAL: 0.4 mg/dL (ref 0.3–1.0)
BUN/CREA RATIO: 22 (ref 6–22)
BUN: 19 mg/dL (ref 7–25)
CALCIUM, CORRECTED: 8.9 mg/dL (ref 8.9–10.8)
CALCIUM: 9.1 mg/dL (ref 8.6–10.3)
CHLORIDE: 105 mmol/L (ref 98–107)
CO2 TOTAL: 31 mmol/L (ref 21–31)
CREATININE: 0.85 mg/dL (ref 0.60–1.30)
ESTIMATED GFR: 113 mL/min/{1.73_m2} (ref >59)
GLOBULIN: 2.5 — ABNORMAL LOW (ref 2.9–5.4)
GLUCOSE: 110 mg/dL — ABNORMAL HIGH (ref 74–109)
OSMOLALITY, CALCULATED: 294 mOsm/kg — ABNORMAL HIGH (ref 270–290)
POTASSIUM: 4.8 mmol/L (ref 3.5–5.1)
PROTEIN TOTAL: 6.8 g/dL (ref 6.4–8.9)
SODIUM: 146 mmol/L — ABNORMAL HIGH (ref 136–145)

## 2022-08-28 LAB — LIPID PANEL
CHOL/HDL RATIO: 3.6
CHOLESTEROL: 162 mg/dL (ref <200)
HDL CHOL: 45 mg/dL (ref >=40)
LDL CALC: 81 mg/dL (ref 0–100)
TRIGLYCERIDES: 178 mg/dL — ABNORMAL HIGH (ref <=150)
VLDL CALC: 36 mg/dL (ref 0–50)

## 2022-08-28 LAB — VITAMIN D 25 TOTAL: VITAMIN D 25, TOTAL: 22.28 ng/mL — ABNORMAL LOW (ref 30.00–100.00)

## 2022-08-28 LAB — VITAMIN B12: VITAMIN B 12: 387 pg/mL (ref 180–914)

## 2022-08-28 MED ORDER — SUMATRIPTAN 25 MG TABLET
25.0000 mg | ORAL_TABLET | Freq: Once | ORAL | 5 refills | Status: DC | PRN
Start: 2022-08-28 — End: 2023-09-21

## 2022-08-28 MED ORDER — OMEPRAZOLE 20 MG CAPSULE,DELAYED RELEASE
20.0000 mg | DELAYED_RELEASE_CAPSULE | Freq: Every day | ORAL | 1 refills | Status: DC
Start: 2022-08-28 — End: 2023-09-21

## 2022-08-28 NOTE — Progress Notes (Signed)
[facility]  [address]  [place], [place]  [address]  Phone: [phone] Fax: [phone]    Name: ANTAWAN LAMONS III                       Date of Birth: [birth year]   MRN:  [id number]                         Date of visit: 08/28/2022     Chief Complaint: Follow Up 6 Months (No new problems. )    Current Outpatient Medications   Medication Sig    multivitamin with iron Oral Tablet Take 1 Tablet by mouth Once a day    omeprazole (PRILOSEC) 20 mg Oral Capsule, Delayed Release(E.C.) Take 1 Capsule (20 mg total) by mouth Once a day OTC    omeprazole (PRILOSEC) 40 mg Oral Capsule, Delayed Release(E.C.) Take 1 Capsule (40 mg total) by mouth Once a day (Patient not taking: Reported on 08/28/2022)    pediatric multivitamins Oral Tablet, Chewable Chew 1 Tablet Once a day    rizatriptan (MAXALT) 10 mg Oral Tablet Take 1 Tablet (10 mg total) by mouth Once, as needed for Migraine May repeat in 2 hours if needed.    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     Gained 14#.     B12 deficiency  Causing peripheral neuropathy  On OTC replacement  Labs on 06-23-2020 showed vitamin B12 287  Labs on 12/30/2021 showed vitamin B12 448    Hyperglycemia  Labs on 05-2019 showed hemoglobin A1c 5.8  Labs on 06-23-2020 showed hemoglobin A1c  5.5  Labs on 01/18/2021 showed hemoglobin A1c 5.6  Labs on 12/30/2021 showed hemoglobin A1c 5.5    Edema  Not currently taking lasix    GERD  On Omeprazole  Symptoms improved, so he reduced the dose    Hypertension  Was on Lisinopril  Greatly improved with the weight loss    Anxiety  Was on ativan, vistaril and buspar  Review of records note patient does not believe BuSpar works well    Depression  Not on treatment  Review of records notes patient has received Trazodone, Wellbutrin, Lexapro, Effexor, Depakote, Abilify among others    Migraine  Maxalt seems to be causing palpitations  Thinks he was in Imitrex previously, but it was several years ago    Hypogonadism  On testosterone  I could not find where any work-up on testosterone levels was ever performed by last PCP. 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 Endo had visit, and provided simple replacement without any further evaluation.  Patient called [place] 10-2020 requesting we  write Rx again. Stated insurance does not cover if Endocrine prescribed.   [id number] Patient reports Dr Michelle Nasuti prescibing again. He can do buy and bill through that office.   Labs on 11-20-2019 showed total testosterone 83  Labs on 09-26-2020 showed total testosterone 170  Labs on 06-23-2020 showed total testosterone 297  Labs on 01/18/2021 showed testosterone 141, no recent Rx  07-2022 Reports he was fired by Dr Michelle Nasuti. He missed too many apts.     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 Normal Echo. Normal PFTs.  Released by cardiology 10-2019  Dyspnea mostly resolved.    Vitamin D Deficiency  On replacement  Labs on 06-23-2020 showed vitamin D 33.7    OSA  Patient reporting Diagnosis and usage of CPAP. He is benefitting.   Diagnosed by Dr Felton Clinton around 2021  Patient does not know settings  We do not have copies of settings.   Uses [facility] in [place]    Palpitations  Ongoing since around 08-2021  Workup in hospital has been negative twice.  Our history notes patient using creatine powder, and caffeinated energy drinks before working out. He does not drink much water. Counseling was provided, but patient disagrees that his workout schedule could be contributing to his palpitations.   Patient wanted referred to Interventional Cardiology, but provider refused referral. Suggested to refer to general cardiology. Has apt later this month.   [phone] Back in ED for same symptoms. Workup negative again.   [id number] Saw Dr Alethia Berthold. Workup negative. Suspect due to dehydration.     ROS:  10 systems reviewed and were negative except as noted.   + fatigue  + numbness  + insomnia, anxiety, depression    Objective :  BP 126/69 (Site: Right Arm, Patient Position: Sitting, Cuff Size: Adult)   Pulse 73   Resp 18   Ht 1.753 m (5\' 9" )   Wt 123 kg (270 lb 6 oz)   SpO2 98%   BMI 39.93 kg/m       General:  appears in good health and morbidly obese  Lungs:  Clear to auscultation bilaterally.   Cardiovascular:  regular rate and rhythm  Neurologic:  Grossly normal  Psychiatric:  Normal    Data reviewed:      Assessment/Plan:  Assessment/Plan   1. Primary hypertension    2. Pure hyperglyceridemia    3. Hyperglycemia    4. Gastroesophageal reflux disease without esophagitis    5. Neuropathy (CMS HCC)    6. Migraine without aura and without status migrainosus, not intractable    7. Vitamin D deficiency    8. B12 deficiency    9. Male hypogonadism     10. Erythrocytosis    11. 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 Anxiety    13. Localized edema      Hypertension  Monitor  Fairly good control    GERD  Continue omeprazole, lowered dose    Dyspnea  History of asthma with worsening from COVID-pneumonia  Not needing inhalers at current    Psych  Monitor    Vitamin D deficiency  Continue replacement    Edema  Monitor    Hyperglycemia  Monitor    Hypertriglyceridemia  Monitor  Improved    B12 deficiency  Continue Replacement    Hypogonadism  Follow with Endocrine    Palpitations  Workup remains negative    OSA  Continue auto CPAP    Migraine  Try Imitrex  Can try one of newer agents if needed    Terald Sleeper, DO, [facility]  Internal Medicine

## 2022-08-28 NOTE — Nursing Note (Signed)
Patient is here for his follow up. Patient reports no new problems.

## 2022-08-29 ENCOUNTER — Telehealth (RURAL_HEALTH_CENTER): Payer: Self-pay | Admitting: INTERNAL MEDICINE

## 2022-08-29 LAB — HGA1C (HEMOGLOBIN A1C WITH EST AVG GLUCOSE): HEMOGLOBIN A1C: 5.6 % (ref 4.0–6.0)

## 2022-08-29 NOTE — Telephone Encounter (Signed)
-----   Message from Terald Sleeper, DO sent at 08/29/2022  1:04 PM EDT -----  Labs look ok

## 2022-08-29 NOTE — Telephone Encounter (Signed)
Patient was notified.

## 2022-08-30 LAB — LDL CHOLESTEROL, DIRECT: LDL DIRECT: 122 mg/dL — ABNORMAL HIGH (ref <100)

## 2022-09-26 ENCOUNTER — Telehealth (RURAL_HEALTH_CENTER): Payer: Self-pay | Admitting: INTERNAL MEDICINE

## 2022-09-26 NOTE — Telephone Encounter (Signed)
I saw on the referral that pt needs new testosterone labs before urology can schedule him

## 2022-09-27 ENCOUNTER — Other Ambulatory Visit (RURAL_HEALTH_CENTER): Payer: Self-pay | Admitting: INTERNAL MEDICINE

## 2022-09-27 DIAGNOSIS — E291 Testicular hypofunction: Secondary | ICD-10-CM

## 2022-09-27 NOTE — Telephone Encounter (Signed)
Order placed

## 2023-01-05 ENCOUNTER — Ambulatory Visit (INDEPENDENT_AMBULATORY_CARE_PROVIDER_SITE_OTHER): Payer: Managed Care, Other (non HMO) | Admitting: Student in an Organized Health Care Education/Training Program

## 2023-01-05 ENCOUNTER — Encounter (INDEPENDENT_AMBULATORY_CARE_PROVIDER_SITE_OTHER): Payer: Self-pay | Admitting: Student in an Organized Health Care Education/Training Program

## 2023-01-05 ENCOUNTER — Other Ambulatory Visit: Payer: Self-pay

## 2023-01-05 DIAGNOSIS — E291 Testicular hypofunction: Secondary | ICD-10-CM

## 2023-01-05 MED ORDER — TESTOSTERONE CYPIONATE 200 MG/ML INTRAMUSCULAR OIL
100.0000 mg | TOPICAL_OIL | INTRAMUSCULAR | 3 refills | Status: DC
Start: 2023-01-05 — End: 2023-09-21

## 2023-01-05 NOTE — Progress Notes (Signed)
UROLOGY, [facility]  [address] [place] [address]    Progress Note    Name: Aaron Skinner MRN:  [id number]   Date: 01/05/2023 DOB:  12/13/1980 (42 y.o.)             Chief Complaint: New Patient (Pt referred for evaluation of low testosterone. )  Subjective   Subjective:   Aaron Skinner is a pleasant 42yoM with a history of hypogonadism formerly on testosterone. He reports now that he is off testosterone he feels low energy and poor libido. His last testosterone was 67. He used IM TRT for about 6 months. He is interested in restarting therapy. He denies fevers, chills, nausea, vomiting, hematuria, dysuria, flank pain, incontinence, dribbling, hesitancy, suprapubic pain, headaches, vision changes, shortness of breath, chest pain.           Objective   Objective :  BP 129/80 (Site: Right Arm, Patient Position: Sitting, Cuff Size: Adult)   Pulse 94   Ht 1.753 m (5\' 9" )   Wt 130 kg (287 lb)   BMI 42.38 kg/m       Gen: NAD, alert  Pulm: unlabored at rest  CV: palpable pulses  Abd: soft, Nt/ND  GU: no suprapubic tenderness, no CVAT    Data reviewed:    Current Outpatient Medications   Medication Sig    cholecalciferol, vitamin D3, 1,250 mcg (50,000 unit) Oral Capsule 1,250 mcg    dextroamphetamine-amphetamine (ADDERALL XR) 10 mg Oral Capsule, Sust. Release 24 hr     hydrOXYzine HCL (ATARAX) 25 mg Oral Tablet 1 Tablet (25 mg total)    multivitamin with iron Oral Tablet Take 1 Tablet by mouth Once a day    omeprazole (PRILOSEC) 20 mg Oral Capsule, Delayed Release(E.C.) Take 1 Capsule (20 mg total) by mouth Once a day for 180 days OTC    pediatric multivitamins Oral Tablet, Chewable Chew 1 Tablet Once a day    SUMAtriptan (IMITREX) 25 mg Oral Tablet Take 1 Tablet (25 mg total) by mouth Once, as needed for Migraine May repeat in 2 hours in needed    testosterone cypionate (DEPO-TESTOTERONE) 200 mg/mL IntraMUSCULAR Oil Inject 100 mg/m2 into the muscle Every 14 days (Patient not taking: Reported on  08/28/2022)        Assessment/Plan  Problem List Items Addressed This Visit          Urology    Male hypogonadism       Hypogonadism   Start IM TRT 100 mg every 7 days  The differential diagnosis, pathophysiology and nature of hypogonadism was discussed with patient as well as potential benefits of treatment in men with consistent symptoms and signs and unequivocally low serum testosterone levels, including:  Improved sexual desire and function  Increased bone mineral density  Improved mood, energy and quality of life  Changes in body composition and improved muscle mass and strength  Improved cognitive function  We discussed the currently available delivery options of testosterone replacement therapy for symptomatic hypogonadism with patient including:  Intramuscular injections (eg. Testosterone Enthanate):  Potential risks of supraphysiologic pharmacokinetics, need for regular injections and injection site pain, reactions and/or pruritis  Transdermal therapy:  Gel (eg. Androgel):  Cost-prohibitiveness, daily application, risk of transference to intimate contacts  Patch (eg. Androderm):  Cost-prohibitiveness, daily application, application site reactions and/or pruritis  Transbuccal delivery (eg. Striant):  Cost-prohibitiveness, twice daily application, possible oral irritation, transfer of salivary testosterone to the partner, unintended ingestion and/or alterations  in taste  Implantable testosterone (eg. Testopel):  Pain during implantation, risk of extrusion, long effect of drug action  Patient was made aware of potential side effects including more erections than normal or erections that last a long time, nausea, vomiting, changes in skin color, ankle swelling, changes in body hair, male pattern baldness, acne, suppression of certain clotting factors, breast enlargement, bleeding in patients on blood thinners, increase or decrease in libido, headache, anxiety, depression and/or hepatotoxicity  Additionally, we  discussed the recent FDA drug safety communication pertaining to testosterone replacement therapy [April 01, 2013] which concluded "...there is a possible increased cardiovascular risk associated with testosterone use..." and possible "...increased risk of heart attack, stroke, or death..."  In accordance with the Endocrine Society Clinical Practice Guideline, patient agreed to the following monitoring schedule while on therapy:  Clinical evaluation three months after treatment initiation and then annually to assess whether symptoms have responded to treatment and whether the patient is suffering any adverse effects  Serum testosterone levels 3 months after initiation of testosterone therapy  Hematocrit at baseline, at 3 months, and then annually.   Liver function tests at baseline, at 3 months, and then annually.   Digital examination of the prostate and PSA measurement before initiating treatment, at 3 months, and then in accordance with evidence-based guidelines for prostate cancer screening, depending on the age and race of the patient.  Follow up in 3 months or sooner as needed  Next Testosterone:  3 months  Next Hematocrit:  3 months  Next DRE/PSA:  3 months  Next Liver Profile:  3 months        Aaron Hove, DO     A combined total of 45 minutes were spent preparing to see the patient, reviewing previous records, ordering tests/medications/procedures, documenting the clinical encounter as well as performing a medically appropriate evaluation and independently interpreting results and communicating them to the patient/family/caregiver as specifically outlined above in the impression and plan.    This note may have been fully or partially generated using MModal Fluency Direct system, and there may be some incorrect words, spellings, and punctuation that were not identified in checking the note before saving.

## 2023-02-02 ENCOUNTER — Ambulatory Visit (INDEPENDENT_AMBULATORY_CARE_PROVIDER_SITE_OTHER): Payer: Self-pay | Admitting: Student in an Organized Health Care Education/Training Program

## 2023-05-11 ENCOUNTER — Telehealth (INDEPENDENT_AMBULATORY_CARE_PROVIDER_SITE_OTHER): Payer: Self-pay | Admitting: Physician Assistant

## 2023-07-28 ENCOUNTER — Other Ambulatory Visit: Payer: Self-pay

## 2023-07-28 ENCOUNTER — Other Ambulatory Visit (HOSPITAL_BASED_OUTPATIENT_CLINIC_OR_DEPARTMENT_OTHER): Payer: Self-pay

## 2023-07-28 ENCOUNTER — Ambulatory Visit
Admission: RE | Admit: 2023-07-28 | Discharge: 2023-07-28 | Disposition: A | Discharge status: Home / Self Care | Source: Ambulatory Visit

## 2023-07-28 DIAGNOSIS — M25571 Pain in right ankle and joints of right foot: Secondary | ICD-10-CM

## 2023-07-28 DIAGNOSIS — M7989 Other specified soft tissue disorders: Secondary | ICD-10-CM

## 2023-07-29 ENCOUNTER — Other Ambulatory Visit (HOSPITAL_BASED_OUTPATIENT_CLINIC_OR_DEPARTMENT_OTHER): Payer: Self-pay

## 2023-07-29 ENCOUNTER — Ambulatory Visit
Admission: RE | Admit: 2023-07-29 | Discharge: 2023-07-29 | Disposition: A | Discharge status: Home / Self Care | Source: Ambulatory Visit

## 2023-07-29 DIAGNOSIS — M25571 Pain in right ankle and joints of right foot: Secondary | ICD-10-CM | POA: Insufficient documentation

## 2023-07-29 DIAGNOSIS — M7989 Other specified soft tissue disorders: Secondary | ICD-10-CM

## 2023-09-21 ENCOUNTER — Other Ambulatory Visit: Payer: Self-pay

## 2023-09-21 ENCOUNTER — Ambulatory Visit: Payer: Self-pay | Attending: PHYSICIAN ASSISTANT | Admitting: PHYSICIAN ASSISTANT

## 2023-09-21 ENCOUNTER — Encounter (INDEPENDENT_AMBULATORY_CARE_PROVIDER_SITE_OTHER): Payer: Self-pay | Admitting: PHYSICIAN ASSISTANT

## 2023-09-21 VITALS — BP 123/86 | HR 79 | Temp 98.7°F | Resp 18 | Ht 69.0 in | Wt 294.0 lb

## 2023-09-21 DIAGNOSIS — G43909 Migraine, unspecified, not intractable, without status migrainosus: Secondary | ICD-10-CM | POA: Insufficient documentation

## 2023-09-21 DIAGNOSIS — M542 Cervicalgia: Secondary | ICD-10-CM | POA: Insufficient documentation

## 2023-09-21 DIAGNOSIS — E291 Testicular hypofunction: Secondary | ICD-10-CM | POA: Insufficient documentation

## 2023-09-21 DIAGNOSIS — R221 Localized swelling, mass and lump, neck: Secondary | ICD-10-CM | POA: Insufficient documentation

## 2023-09-21 DIAGNOSIS — R5383 Other fatigue: Secondary | ICD-10-CM | POA: Insufficient documentation

## 2023-09-21 DIAGNOSIS — K219 Gastro-esophageal reflux disease without esophagitis: Secondary | ICD-10-CM | POA: Insufficient documentation

## 2023-09-21 DIAGNOSIS — E538 Deficiency of other specified B group vitamins: Secondary | ICD-10-CM | POA: Insufficient documentation

## 2023-09-21 DIAGNOSIS — E559 Vitamin D deficiency, unspecified: Secondary | ICD-10-CM | POA: Insufficient documentation

## 2023-09-21 DIAGNOSIS — J069 Acute upper respiratory infection, unspecified: Secondary | ICD-10-CM | POA: Insufficient documentation

## 2023-09-21 DIAGNOSIS — R7303 Prediabetes: Secondary | ICD-10-CM | POA: Insufficient documentation

## 2023-09-21 LAB — CBC WITH DIFF
BASOPHIL #: 0 x10ˆ3/uL (ref 0.00–0.10)
BASOPHIL %: 1 % (ref 0–1)
EOSINOPHIL #: 0.1 x10ˆ3/uL (ref 0.00–0.60)
EOSINOPHIL %: 2 % (ref 1–8)
HCT: 48.4 % — ABNORMAL HIGH (ref 36.7–47.1)
HGB: 16.7 g/dL — ABNORMAL HIGH (ref 12.5–16.3)
LYMPHOCYTE #: 1.2 x10ˆ3/uL (ref 1.00–3.00)
LYMPHOCYTE %: 19 % (ref 15–43)
MCH: 29.4 pg (ref 23.8–33.4)
MCHC: 34.6 g/dL (ref 32.5–36.3)
MCV: 85 fL (ref 73.0–96.2)
MONOCYTE #: 0.7 x10ˆ3/uL (ref 0.30–1.10)
MONOCYTE %: 11 % (ref 6–14)
MPV: 8.4 fL (ref 7.4–11.4)
NEUTROPHIL #: 4.5 x10ˆ3/uL (ref 1.85–7.84)
NEUTROPHIL %: 68 % (ref 44–74)
PLATELETS: 278 x10ˆ3/uL (ref 140–440)
RBC: 5.69 x10ˆ6/uL — ABNORMAL HIGH (ref 4.06–5.63)
RDW: 13.9 % (ref 12.1–16.2)
WBC: 6.6 x10ˆ3/uL (ref 3.6–10.2)

## 2023-09-21 LAB — COMPREHENSIVE METABOLIC PNL, FASTING
ALBUMIN/GLOBULIN RATIO: 1.7 — ABNORMAL HIGH (ref 0.8–1.4)
ALBUMIN: 4.5 g/dL (ref 3.5–5.7)
ALKALINE PHOSPHATASE: 68 U/L (ref 34–104)
ALT (SGPT): 44 U/L (ref 7–52)
ANION GAP: 8 mmol/L (ref 4–13)
AST (SGOT): 21 U/L (ref 13–39)
BILIRUBIN TOTAL: 0.5 mg/dL (ref 0.3–1.0)
BUN/CREA RATIO: 14 (ref 6–22)
BUN: 14 mg/dL (ref 7–25)
CALCIUM, CORRECTED: 9.2 mg/dL (ref 8.9–10.8)
CALCIUM: 9.6 mg/dL (ref 8.6–10.3)
CHLORIDE: 106 mmol/L (ref 98–107)
CO2 TOTAL: 25 mmol/L (ref 21–31)
CREATININE: 0.99 mg/dL (ref 0.60–1.30)
ESTIMATED GFR: 98 mL/min/1.73mˆ2 (ref >59)
GLOBULIN: 2.7 (ref 2.0–3.5)
GLUCOSE: 99 mg/dL (ref 74–109)
OSMOLALITY, CALCULATED: 278 mosm/kg (ref 270–290)
POTASSIUM: 4.1 mmol/L (ref 3.5–5.1)
PROTEIN TOTAL: 7.2 g/dL (ref 6.4–8.9)
SODIUM: 139 mmol/L (ref 136–145)

## 2023-09-21 LAB — HGA1C (HEMOGLOBIN A1C WITH EST AVG GLUCOSE): HEMOGLOBIN A1C: 5.5 % (ref 4.0–6.0)

## 2023-09-21 LAB — THYROID STIMULATING HORMONE (SENSITIVE TSH): TSH: 1.28 u[IU]/mL (ref 0.450–5.330)

## 2023-09-21 LAB — VITAMIN B12: VITAMIN B 12: 292 pg/mL (ref 180–914)

## 2023-09-21 LAB — VITAMIN D 25 TOTAL: VITAMIN D 25, TOTAL: 24.16 ng/mL — ABNORMAL LOW (ref 30.00–100.00)

## 2023-09-21 MED ORDER — OMEPRAZOLE 20 MG CAPSULE,DELAYED RELEASE
20.0000 mg | DELAYED_RELEASE_CAPSULE | Freq: Every day | ORAL | 1 refills | Status: DC
Start: 2023-09-21 — End: 2023-11-09

## 2023-09-21 MED ORDER — NURTEC ODT 75 MG DISINTEGRATING TABLET
75.0000 mg | ORAL_TABLET | ORAL | 0 refills | Status: DC | PRN
Start: 2023-09-21 — End: 2023-11-09

## 2023-09-21 MED ORDER — CHOLECALCIFEROL (VITAMIN D3) 1,250 MCG (50,000 UNIT) CAPSULE: 50000.0000 [IU] | ORAL_CAPSULE | ORAL | 1 refills | Status: DC

## 2023-09-21 NOTE — Nursing Note (Signed)
 09/21/23 1021   Recent Weight Change   Have you had a recent unexplained weight loss or gain? N   Health Education and Literacy   How often do you have a problem understanding what is told to you about your medical condition?  Never   Domestic Violence   Because we are aware of abuse and domestic violence today, we ask all patients: Are you being hurt, hit, or frightened by anyone at your home or in your life?  N   Basic Needs   Do you have any basic needs within your home that are not being met? (such as Food, Shelter, [employer] fast streamer, Tranportation, paying for bills and/or medications) N   Advanced Directives   Do you have any advanced directives? No Advance   Would you like an advanced directive packet? Accepted Packet

## 2023-09-21 NOTE — Nursing Note (Signed)
 09/21/23 1023   PHQ 9 (follow up)   Little interest or pleasure in doing things. 0   Feeling down, depressed, or hopeless 0

## 2023-09-21 NOTE — Assessment & Plan Note (Addendum)
 Orders:    rimegepant (NURTEC ODT ) 75 mg Oral Tablet, Rapid Dissolve; Place 1 Tablet (75 mg total) under the tongue Every 48 hours as needed for Migraine for up to 30 days

## 2023-09-21 NOTE — Assessment & Plan Note (Addendum)
 Orders:    VITAMIN B12; Future

## 2023-09-21 NOTE — Progress Notes (Signed)
 [facility]  FAMILY MEDICINE, MEDICAL OFFICE BUILDING  [address]  [place] [address]  [phone]   Aaron Skinner  [birth year]  [id number]    Date of Service: 09/21/2023  Chief complaint:   Chief Complaint   Patient presents with    New Patient     Previous PCP Dr. Camellia Broaden.     Sinus Infection     States that he and his wife picked up a sinus infection while on vacation to [place], [place]. He has sinus drainage with post nasal drip that is clear, a little sore throat from drainage and sinus pressure. He has been taking Sudafed. Denies any body aches or fever.     Migraine     Imitrex  and Maxalt  in the past which does not help. Requesting to discuss new medications to prevent Migraine headaches. States he works on the [employer] and losses his vision for > 1 hour and his speech gets splurred when migraines come on.    Diabetes     States that he was advised that he was pre-diabetic > 1 year ago. He has been taking Semaglutide 8.5 mg on Mondays and is paying for it out of pocket x 2 weeks. He was to have his A1c checked to see if it has increased in the last year and to see if his insurance will pay for Ozempic. Gives family history of Diabetes.    Neck Pain     Neck pain on the left side x 2 weeks that comes/goes mostly when laying down at night. Describes as tingling, sharp pain when it comes about and has a tender spot on neck. Denies any dizziness.     ADHD     Has a prescription for Adderall 15 mg daily which he hasn't taken in 2 weeks, states that if he gets dehydrated he starts having PAC's/PVC's and doesn't think it is a good fit with his symptoms in the summer time.      Subjective:   Aaron Skinner is a 43 y.o. male and presents today for establishment of care. He was previously seen by Dr. Broaden. PMH included reflux, migraines, ADHD, depression/anxiety, HTN, B12 def and low testosterone . Has multiple concerns today.  He c/o sore throat, sinus  drainage x 5 days. Mild cough. Wife sick with similar symptoms after a trip to [place]. Feels like it may have been from the hotel air conditioner. Denies fever, body aches, SOB. Wife's covid test last night was negative.   Notes hx of migraine headaches. Began in childhood. Vision and speech changes. He has tried Imitrex  and maxalt  in the past without benefit and he also notes heart palpitations after taking. He notes 1-2 headaches monthly. He does note food triggers. Never had any imaging.   He reports hx of prediabetes and he just started compounded semaglutide through [facility]. Last A1C in July 2024 was normal at 5.6, he reports at one point it was 6.4.  Aso c/o Anterior Neck pain radiating behind the collar bone x 2 weeks. Tingling, sharp pains that come and go multiple times an hour for 5-10 seconds. Worse when he lays down. Denies dizziness, chest pain.     Past Medical History:   Diagnosis Date    Allergic rhinitis     Anxiety     COVID     Esophageal reflux     Excessive daytime sleepiness     Hypertension     does  not have anymore    Tachycardia, unspecified          Past Surgical History:   Procedure Laterality Date    DENTAL SURGERY      HX WISDOM TEETH EXTRACTION           Current Outpatient Medications   Medication Sig Dispense Refill    aspirin 81 mg Oral Tablet, Chewable Chew 1 Tablet (81 mg total) Once per day as needed      cholecalciferol , vitamin D3, 1,250 mcg (50,000 unit) Oral Capsule Take 1 Capsule (50,000 Units total) by mouth Every 7 days for 180 days 12 Capsule 1    dextroamphetamine-amphetamine (ADDERALL XR) 10 mg Oral Capsule, Sust. Release 24 hr  (Patient not taking: Reported on 09/21/2023)      multivitamin with iron Oral Tablet Take 1 Tablet by mouth Once a day      omeprazole  (PRILOSEC) 20 mg Oral Capsule, Delayed Release(E.C.) Take 1 Capsule (20 mg total) by mouth Once a day for 180 days OTC 90 Capsule 1    rimegepant (NURTEC ODT ) 75 mg Oral Tablet, Rapid Dissolve  Place 1 Tablet (75 mg total) under the tongue Every 48 hours as needed for Migraine for up to 30 days 10 Tablet 0     No current facility-administered medications for this visit.     Allergies[1]  Review of Systems:  Any pertinent Review of Systems as addressed in the HPI above.    Objective:   BP 123/86 (Site: Left Arm, Patient Position: Sitting, Cuff Size: Adult Large)   Pulse 79   Temp 37.1 C (98.7 F) (Temporal)   Resp 18   Ht 1.753 m (5' 9)   Wt 133 kg (294 lb)   SpO2 99%   BMI 43.42 kg/m       BMI addressed: Advised on diet, weight loss, and exercise to reduce above normal BMI.       Constitutional: No acute distress. Alert and oriented.  Head: Normocephalic, atraumatic.  Eyes: Clear. PERRL. EOMI.  Ears: Canals and TMs clear and intact bilaterally.  Nose: Patent. No rhinorrhea.  Throat: mild erythema with PND  Neck: Supple. No lymphadenopathy. No bruit. Anterior neck fullness.   Lungs: Clear to auscultation bilaterally. No wheezes, rhonchi, or rales.  Heart: Regular rate and rhythm. No murmurs.  Abdomen: Soft, non-tender, nondistended. BS active.  Extremities: Intact x 4. No edema. Gait stable.  Neuro: CN 2-12 grossly intact.   Psych: Mood and affect appropriate and congruent.    Laboratory:  A1C: 5.6  A1C Date: 08/28/2022  COMPLETE BLOOD COUNT   Lab Results   Component Value Date    WBC 5.9 08/28/2022    HGB 15.6 08/28/2022    HCT 46.3 08/28/2022    PLTCNT 242 08/28/2022       DIFFERENTIAL  Lab Results   Component Value Date    PMNS 62 08/28/2022    LYMPHOCYTES 25 08/28/2022    MONOCYTES 12 08/28/2022    EOSINOPHIL 2 08/28/2022    BASOPHILS 0 08/28/2022    BASOPHILS 0.00 08/28/2022    PMNABS 3.60 08/28/2022    LYMPHSABS 1.40 08/28/2022    EOSABS 0.10 08/28/2022    MONOSABS 0.70 08/28/2022     BASIC METABOLIC PANEL  Lab Results   Component Value Date    SODIUM 146 (H) 08/28/2022    POTASSIUM 4.8 08/28/2022    CHLORIDE 105 08/28/2022    CO2 31 08/28/2022    ANIONGAP 10 08/28/2022  BUN 19 08/28/2022     CREATININE 0.85 08/28/2022    BUNCRRATIO 22 08/28/2022    GFR 113 08/28/2022    CALCIUM 9.1 08/28/2022    GLUCOSENF 110 (H) 08/28/2022           Most recent lab results reviewed with patient.    Assessment & Plan  Prediabetes    Orders:    HGA1C (HEMOGLOBIN A1C WITH EST AVG GLUCOSE); Future    COMPREHENSIVE METABOLIC PNL, FASTING; Future    Migraine    Orders:    rimegepant (NURTEC ODT ) 75 mg Oral Tablet, Rapid Dissolve; Place 1 Tablet (75 mg total) under the tongue Every 48 hours as needed for Migraine for up to 30 days    B12 deficiency    Orders:    VITAMIN B12; Future    Anterior neck pain    Orders:    THYROID  STIMULATING HORMONE (SENSITIVE TSH); Future    CT SOFT TISSUE NECK W IV CONTRAST; Future    Fatigue, unspecified type    Orders:    CBC/DIFF; Future    COMPREHENSIVE METABOLIC PNL, FASTING; Future    THYROID  STIMULATING HORMONE (SENSITIVE TSH); Future    TESTOSTERONE  FREE (DIALYSIS) AND TOTAL,MS; Future    Gastroesophageal reflux disease, unspecified whether esophagitis present    Orders:    omeprazole  (PRILOSEC) 20 mg Oral Capsule, Delayed Release(E.C.); Take 1 Capsule (20 mg total) by mouth Once a day for 180 days OTC    Vitamin D  deficiency    Orders:    cholecalciferol , vitamin D3, 1,250 mcg (50,000 unit) Oral Capsule; Take 1 Capsule (50,000 Units total) by mouth Every 7 days for 180 days    VITAMIN D  25 TOTAL; Future    Hypogonadism in male    Orders:    TESTOSTERONE  FREE (DIALYSIS) AND TOTAL,MS; Future    Neck fullness    Orders:    CT SOFT TISSUE NECK W IV CONTRAST; Future    Acute URI  If symptoms worsen or persist pt encouraged to notify the office and we will prescribe antibiotics.               Counseling with regards to preventative care given. Medication summary was discussed with the patient to verify compliance and understanding. Medication safety was discussed. A good faith effort was made to reconcile the patient's medications.     The patient was given the opportunity to ask questions  and those questions were answered to the patient's satisfaction. The patient was encouraged to call with any additional questions or concerns. More than 50% of the visit was spent counseling and coordinating care.    Orders Placed This Encounter    CT SOFT TISSUE NECK W IV CONTRAST    VITAMIN B12    HGA1C (HEMOGLOBIN A1C WITH EST AVG GLUCOSE)    VITAMIN D  25 TOTAL    CBC/DIFF    COMPREHENSIVE METABOLIC PNL, FASTING    THYROID  STIMULATING HORMONE (SENSITIVE TSH)    TESTOSTERONE  FREE (DIALYSIS) AND TOTAL,MS    omeprazole  (PRILOSEC) 20 mg Oral Capsule, Delayed Release(E.C.)    cholecalciferol , vitamin D3, 1,250 mcg (50,000 unit) Oral Capsule    rimegepant (NURTEC ODT ) 75 mg Oral Tablet, Rapid Dissolve                  Return in about 6 weeks (around 11/02/2023).  [address], PA-C 09/21/2023, 10:58         [1]   Allergies  Allergen Reactions    Clindamycin  GI Bleed     Other Reaction(s): Other (see comments)    Stomach ulcers

## 2023-09-21 NOTE — Assessment & Plan Note (Addendum)
 Orders:    omeprazole  (PRILOSEC) 20 mg Oral Capsule, Delayed Release(E.C.); Take 1 Capsule (20 mg total) by mouth Once a day for 180 days OTC

## 2023-09-21 NOTE — Assessment & Plan Note (Addendum)
 Orders:    cholecalciferol , vitamin D3, 1,250 mcg (50,000 unit) Oral Capsule; Take 1 Capsule (50,000 Units total) by mouth Every 7 days for 180 days    VITAMIN D  25 TOTAL; Future

## 2023-09-25 LAB — TESTOSTERONE FREE (DIALYSIS) AND TOTAL,MS
TESTOSTERONE, FREE: 45.7 pg/mL (ref 35.0–155.0)
TESTOSTERONE,TOTAL,LC/MS/MS: 178 ng/dL — ABNORMAL LOW (ref 250–1100)

## 2023-09-26 ENCOUNTER — Ambulatory Visit (INDEPENDENT_AMBULATORY_CARE_PROVIDER_SITE_OTHER): Payer: Self-pay | Admitting: PHYSICIAN ASSISTANT

## 2023-09-26 DIAGNOSIS — R5383 Other fatigue: Secondary | ICD-10-CM

## 2023-09-26 DIAGNOSIS — E291 Testicular hypofunction: Secondary | ICD-10-CM

## 2023-10-03 ENCOUNTER — Other Ambulatory Visit (INDEPENDENT_AMBULATORY_CARE_PROVIDER_SITE_OTHER): Payer: Self-pay | Admitting: PHYSICIAN ASSISTANT

## 2023-10-03 DIAGNOSIS — G43909 Migraine, unspecified, not intractable, without status migrainosus: Secondary | ICD-10-CM

## 2023-10-09 ENCOUNTER — Other Ambulatory Visit (INDEPENDENT_AMBULATORY_CARE_PROVIDER_SITE_OTHER): Payer: Self-pay | Admitting: PHYSICIAN ASSISTANT

## 2023-11-07 ENCOUNTER — Ambulatory Visit: Attending: PHYSICIAN ASSISTANT

## 2023-11-07 ENCOUNTER — Other Ambulatory Visit: Payer: Self-pay

## 2023-11-07 DIAGNOSIS — R5383 Other fatigue: Secondary | ICD-10-CM | POA: Insufficient documentation

## 2023-11-07 DIAGNOSIS — E291 Testicular hypofunction: Secondary | ICD-10-CM | POA: Insufficient documentation

## 2023-11-07 LAB — CBC WITH DIFF
BASOPHIL #: 0 x10ˆ3/uL (ref 0.00–0.10)
BASOPHIL %: 1 % (ref 0–1)
EOSINOPHIL #: 0.1 x10ˆ3/uL (ref 0.00–0.60)
EOSINOPHIL %: 2 % (ref 1–8)
HCT: 44.5 % (ref 36.7–47.1)
HGB: 15.9 g/dL (ref 12.5–16.3)
LYMPHOCYTE #: 1.5 x10ˆ3/uL (ref 1.00–3.00)
LYMPHOCYTE %: 23 % (ref 15–43)
MCH: 29.7 pg (ref 23.8–33.4)
MCHC: 35.7 g/dL (ref 32.5–36.3)
MCV: 83.1 fL (ref 73.0–96.2)
MONOCYTE #: 0.6 x10ˆ3/uL (ref 0.30–1.10)
MONOCYTE %: 10 % (ref 6–14)
MPV: 8.1 fL (ref 7.4–11.4)
NEUTROPHIL #: 4.3 x10ˆ3/uL (ref 1.85–7.84)
NEUTROPHIL %: 65 % (ref 44–74)
PLATELETS: 276 x10ˆ3/uL (ref 140–440)
RBC: 5.36 x10ˆ6/uL (ref 4.06–5.63)
RDW: 14.2 % (ref 12.1–16.2)
WBC: 6.7 x10ˆ3/uL (ref 3.6–10.2)

## 2023-11-09 ENCOUNTER — Other Ambulatory Visit: Payer: Self-pay

## 2023-11-09 ENCOUNTER — Encounter (INDEPENDENT_AMBULATORY_CARE_PROVIDER_SITE_OTHER): Payer: Self-pay | Admitting: PHYSICIAN ASSISTANT

## 2023-11-09 ENCOUNTER — Ambulatory Visit (HOSPITAL_COMMUNITY): Payer: Self-pay

## 2023-11-09 ENCOUNTER — Ambulatory Visit: Payer: Self-pay | Attending: PHYSICIAN ASSISTANT | Admitting: PHYSICIAN ASSISTANT

## 2023-11-09 VITALS — BP 130/80 | HR 74 | Temp 98.3°F | Ht 69.0 in | Wt 299.6 lb

## 2023-11-09 DIAGNOSIS — M542 Cervicalgia: Secondary | ICD-10-CM | POA: Insufficient documentation

## 2023-11-09 DIAGNOSIS — G43909 Migraine, unspecified, not intractable, without status migrainosus: Secondary | ICD-10-CM | POA: Insufficient documentation

## 2023-11-09 DIAGNOSIS — Z23 Encounter for immunization: Secondary | ICD-10-CM | POA: Insufficient documentation

## 2023-11-09 DIAGNOSIS — K219 Gastro-esophageal reflux disease without esophagitis: Secondary | ICD-10-CM | POA: Insufficient documentation

## 2023-11-09 MED ORDER — SUCRALFATE 1 GRAM TABLET
1.0000 g | ORAL_TABLET | Freq: Three times a day (TID) | ORAL | 0 refills | Status: AC
Start: 2023-11-09 — End: 2023-11-19

## 2023-11-09 MED ORDER — NURTEC ODT 75 MG DISINTEGRATING TABLET: 75.0000 mg | ORAL_TABLET | ORAL | 2 refills | Status: DC | PRN

## 2023-11-09 MED ORDER — OMEPRAZOLE 20 MG CAPSULE,DELAYED RELEASE: 20.0000 mg | DELAYED_RELEASE_CAPSULE | Freq: Every day | ORAL | 1 refills | Status: DC

## 2023-11-09 NOTE — Nursing Note (Signed)
 11/09/23 0816   Fall Risk Assessment   Do you feel unsteady when standing or walking? No   Do you worry about falling? No   Have you fallen in the past year? No

## 2023-11-09 NOTE — Assessment & Plan Note (Signed)
 Orders:    rimegepant (NURTEC ODT ) 75 mg Oral Tablet, Rapid Dissolve; Place 1 Tablet (75 mg total) under the tongue Every 48 hours as needed for Migraine

## 2023-11-09 NOTE — Assessment & Plan Note (Signed)
 Orders:    sucralfate (CARAFATE) 1 gram Oral Tablet; Take 1 Tablet (1 g total) by mouth Three times daily before meals for 10 days    omeprazole  (PRILOSEC) 20 mg Oral Capsule, Delayed Release(E.C.); Take 1 Capsule (20 mg total) by mouth Once a day OTC

## 2023-11-09 NOTE — Progress Notes (Signed)
 [facility]  FAMILY MEDICINE, MEDICAL OFFICE BUILDING  [address]  [place] [address]  [phone]   AULTON ROUTT Skinner  [birth year]  [id number]    Date of Service: 11/09/2023  Chief complaint:   Chief Complaint   Patient presents with    Follow Up 8 Weeks     Subjective:   Aaron Skinner is a 43 y.o. male and presents today for follow-up on chronic disease management and medication refills.   Neck pain - patient canceled ct because neck pain resolved. He states he also had concerns about insurance not paying.   Headaches - no migraines since starting Nurtec. He is tolerating well.   Obesity - He reports he did get semaglutide through online provider but he is d/c. He had very bad side effects. Severe diarrhea and increased heartburn. Asks about something to help stomach.   Denies chest pain, SOB, heart flutters.     Past Medical History:   Diagnosis Date    Allergic rhinitis     Anxiety     COVID     Esophageal reflux     Excessive daytime sleepiness     Hypertension     does not have anymore    Tachycardia, unspecified          Past Surgical History:   Procedure Laterality Date    DENTAL SURGERY      HX WISDOM TEETH EXTRACTION           Current Outpatient Medications   Medication Sig Dispense Refill    cholecalciferol , vitamin D3, 1,250 mcg (50,000 unit) Oral Capsule Take 1 Capsule (50,000 Units total) by mouth Every 7 days for 180 days 12 Capsule 1    multivitamin with iron Oral Tablet Take 1 Tablet by mouth Once a day      omeprazole  (PRILOSEC) 20 mg Oral Capsule, Delayed Release(E.C.) Take 1 Capsule (20 mg total) by mouth Once a day OTC 90 Capsule 1    rimegepant (NURTEC ODT ) 75 mg Oral Tablet, Rapid Dissolve Place 1 Tablet (75 mg total) under the tongue Every 48 hours as needed for Migraine 16 Tablet 2    sucralfate (CARAFATE) 1 gram Oral Tablet Take 1 Tablet (1 g total) by mouth Three times daily before meals for 10 days 30 Tablet 0     No current facility-administered  medications for this visit.     Allergies[1]  Review of Systems:  Any pertinent Review of Systems as addressed in the HPI above.    Objective:   BP 130/80   Pulse 74   Temp 36.8 C (98.3 F) (Temporal)   Ht 1.753 m (5' 9)   Wt 136 kg (299 lb 9.6 oz)   SpO2 98%   BMI 44.24 kg/m       BMI addressed: Advised on diet, weight loss, and exercise to reduce above normal BMI.       Constitutional: No acute distress. Alert and oriented.  Head: Normocephalic, atraumatic.  Eyes: Clear. PERRL. EOMI.  Ears: Canals and TMs clear and intact bilaterally.  Nose: Patent. No rhinorrhea.  Throat: Clear, no erythema or drainge.  Neck: Supple. No lymphadenopathy.   Lungs: Clear to auscultation bilaterally. No wheezes, rhonchi, or rales.  Heart: Regular rate and rhythm. No murmurs.  Abdomen: Soft, non-tender, nondistended. BS active.  Extremities: Intact x 4. No edema. Gait stable.  Neuro: CN 2-12 grossly intact.   Psych: Mood and affect appropriate and congruent.  Laboratory:  A1C: 5.5  A1C Date: 09/21/2023  COMPLETE BLOOD COUNT   Lab Results   Component Value Date    WBC 6.7 11/07/2023    HGB 15.9 11/07/2023    HCT 44.5 11/07/2023    PLTCNT 276 11/07/2023       DIFFERENTIAL  Lab Results   Component Value Date    PMNS 65 11/07/2023    LYMPHOCYTES 23 11/07/2023    MONOCYTES 10 11/07/2023    EOSINOPHIL 2 11/07/2023    BASOPHILS 1 11/07/2023    BASOPHILS 0.00 11/07/2023    PMNABS 4.30 11/07/2023    LYMPHSABS 1.50 11/07/2023    EOSABS 0.10 11/07/2023    MONOSABS 0.60 11/07/2023     BASIC METABOLIC PANEL  Lab Results   Component Value Date    SODIUM 139 09/21/2023    POTASSIUM 4.1 09/21/2023    CHLORIDE 106 09/21/2023    CO2 25 09/21/2023    ANIONGAP 8 09/21/2023    BUN 14 09/21/2023    CREATININE 0.99 09/21/2023    BUNCRRATIO 14 09/21/2023    GFR 98 09/21/2023    CALCIUM 9.6 09/21/2023    GLUCOSENF 99 09/21/2023     THYROID  STIMULATING HORMONE  Lab Results   Component Value Date    TSH 1.280 09/21/2023        Most recent lab results  reviewed with patient.    Assessment & Plan  Need for immunization against influenza    Orders:    Flu Vaccine, 6 month-adult,0.5 mL IM (Admin)    Migraine    Orders:    rimegepant (NURTEC ODT ) 75 mg Oral Tablet, Rapid Dissolve; Place 1 Tablet (75 mg total) under the tongue Every 48 hours as needed for Migraine    Morbid obesity (CMS HCC)  D/c semaglutide due to side effects. Recommend diet and 150 minutes of moderate intensity activity weekly.        Anterior neck pain  Resolved. Patient canceled ct scan. If sxs recur contact clinic.        Gastroesophageal reflux disease, unspecified whether esophagitis present    Orders:    sucralfate (CARAFATE) 1 gram Oral Tablet; Take 1 Tablet (1 g total) by mouth Three times daily before meals for 10 days    omeprazole  (PRILOSEC) 20 mg Oral Capsule, Delayed Release(E.C.); Take 1 Capsule (20 mg total) by mouth Once a day OTC           Counseling with regards to preventative care given. Medication summary was discussed with the patient to verify compliance and understanding. Medication safety was discussed. A good faith effort was made to reconcile the patient's medications.     The patient was given the opportunity to ask questions and those questions were answered to the patient's satisfaction. The patient was encouraged to call with any additional questions or concerns. More than 50% of the visit was spent counseling and coordinating care.    Orders Placed This Encounter    Flu Vaccine, 6 month-adult,0.5 mL IM (Admin)    sucralfate (CARAFATE) 1 gram Oral Tablet    omeprazole  (PRILOSEC) 20 mg Oral Capsule, Delayed Release(E.C.)    rimegepant (NURTEC ODT ) 75 mg Oral Tablet, Rapid Dissolve                  Return in about 3 months (around 02/09/2024).  Derrek Patricia, PA-C 11/09/2023, 08:45       [1]   Allergies  Allergen Reactions    Clindamycin GI Bleed  Other Reaction(s): Other (see comments)    Stomach ulcers

## 2023-11-09 NOTE — Nursing Note (Signed)
 11/09/23 9185   Domestic Violence   Because we are aware of abuse and domestic violence today, we ask all patients: Are you being hurt, hit, or frightened by anyone at your home or in your life?  N   Basic Needs   Do you have any basic needs within your home that are not being met? (such as Food, Shelter, Civil Service fast streamer, Tranportation, paying for bills and/or medications) N

## 2023-11-09 NOTE — Nursing Note (Signed)
 11/09/23 0818   Depression Screen   Little interest or pleasure in doing things. 0   Feeling down, depressed, or hopeless 0   PHQ 2 Total 0

## 2023-11-13 ENCOUNTER — Ambulatory Visit (INDEPENDENT_AMBULATORY_CARE_PROVIDER_SITE_OTHER): Payer: Self-pay | Admitting: PHYSICIAN ASSISTANT

## 2023-11-13 DIAGNOSIS — E291 Testicular hypofunction: Secondary | ICD-10-CM

## 2023-11-13 LAB — TESTOSTERONE FREE (DIALYSIS) AND TOTAL,MS
TESTOSTERONE, FREE: 35.8 pg/mL (ref 35.0–155.0)
TESTOSTERONE,TOTAL,LC/MS/MS: 160 ng/dL — ABNORMAL LOW (ref 250–1100)

## 2023-11-14 ENCOUNTER — Other Ambulatory Visit (INDEPENDENT_AMBULATORY_CARE_PROVIDER_SITE_OTHER): Payer: Self-pay | Admitting: PHYSICIAN ASSISTANT

## 2023-11-14 DIAGNOSIS — E291 Testicular hypofunction: Secondary | ICD-10-CM

## 2023-11-14 MED ORDER — TESTOSTERONE CYPIONATE 100 MG/ML INTRAMUSCULAR OIL: TOPICAL_OIL | INTRAMUSCULAR | 1 refills | Status: DC

## 2024-01-02 ENCOUNTER — Encounter (HOSPITAL_COMMUNITY): Payer: Self-pay

## 2024-02-13 ENCOUNTER — Ambulatory Visit: Payer: Self-pay | Attending: PHYSICIAN ASSISTANT | Admitting: PHYSICIAN ASSISTANT

## 2024-02-13 ENCOUNTER — Other Ambulatory Visit: Payer: Self-pay

## 2024-02-13 ENCOUNTER — Encounter (INDEPENDENT_AMBULATORY_CARE_PROVIDER_SITE_OTHER): Payer: Self-pay | Admitting: PHYSICIAN ASSISTANT

## 2024-02-13 VITALS — BP 119/88 | HR 75 | Temp 98.1°F | Resp 20 | Ht 69.0 in | Wt 310.0 lb

## 2024-02-13 DIAGNOSIS — Z125 Encounter for screening for malignant neoplasm of prostate: Secondary | ICD-10-CM | POA: Insufficient documentation

## 2024-02-13 DIAGNOSIS — F410 Panic disorder [episodic paroxysmal anxiety] without agoraphobia: Secondary | ICD-10-CM | POA: Insufficient documentation

## 2024-02-13 DIAGNOSIS — F339 Major depressive disorder, recurrent, unspecified: Secondary | ICD-10-CM | POA: Insufficient documentation

## 2024-02-13 DIAGNOSIS — G43909 Migraine, unspecified, not intractable, without status migrainosus: Secondary | ICD-10-CM | POA: Insufficient documentation

## 2024-02-13 DIAGNOSIS — E559 Vitamin D deficiency, unspecified: Secondary | ICD-10-CM | POA: Insufficient documentation

## 2024-02-13 DIAGNOSIS — K219 Gastro-esophageal reflux disease without esophagitis: Secondary | ICD-10-CM | POA: Insufficient documentation

## 2024-02-13 DIAGNOSIS — E291 Testicular hypofunction: Secondary | ICD-10-CM | POA: Insufficient documentation

## 2024-02-13 DIAGNOSIS — F4310 Post-traumatic stress disorder, unspecified: Secondary | ICD-10-CM | POA: Insufficient documentation

## 2024-02-13 LAB — CBC WITH DIFF
BASOPHIL #: 0 x10ˆ3/uL (ref 0.00–0.10)
BASOPHIL %: 0 % (ref 0–1)
EOSINOPHIL #: 0.1 x10ˆ3/uL (ref 0.00–0.60)
EOSINOPHIL %: 2 % (ref 1–8)
HCT: 47.8 % — ABNORMAL HIGH (ref 36.7–47.1)
HGB: 16.5 g/dL — ABNORMAL HIGH (ref 12.5–16.3)
LYMPHOCYTE #: 1.3 x10ˆ3/uL (ref 1.00–3.00)
LYMPHOCYTE %: 19 % (ref 15–43)
MCH: 29.3 pg (ref 23.8–33.4)
MCHC: 34.5 g/dL (ref 32.5–36.3)
MCV: 84.9 fL (ref 73.0–96.2)
MONOCYTE #: 0.7 x10ˆ3/uL (ref 0.30–1.10)
MONOCYTE %: 11 % (ref 6–14)
MPV: 8.2 fL (ref 7.4–11.4)
NEUTROPHIL #: 4.5 x10ˆ3/uL (ref 1.85–7.84)
NEUTROPHIL %: 68 % (ref 44–74)
PLATELETS: 281 x10ˆ3/uL (ref 140–440)
RBC: 5.63 x10ˆ6/uL (ref 4.06–5.63)
RDW: 14.1 % (ref 12.1–16.2)
WBC: 6.7 x10ˆ3/uL (ref 3.6–10.2)

## 2024-02-13 LAB — COMPREHENSIVE METABOLIC PNL, FASTING
ALBUMIN/GLOBULIN RATIO: 1.7 — ABNORMAL HIGH (ref 0.8–1.4)
ALBUMIN: 4.4 g/dL (ref 3.5–5.7)
ALKALINE PHOSPHATASE: 66 U/L (ref 34–104)
ALT (SGPT): 55 U/L — ABNORMAL HIGH (ref 7–52)
ANION GAP: 9 mmol/L (ref 4–13)
AST (SGOT): 26 U/L (ref 13–39)
BILIRUBIN TOTAL: 0.5 mg/dL (ref 0.3–1.0)
BUN/CREA RATIO: 21 (ref 6–22)
BUN: 18 mg/dL (ref 7–25)
CALCIUM, CORRECTED: 8.9 mg/dL (ref 8.9–10.8)
CALCIUM: 9.2 mg/dL (ref 8.6–10.3)
CHLORIDE: 107 mmol/L (ref 98–107)
CO2 TOTAL: 26 mmol/L (ref 21–31)
CREATININE: 0.86 mg/dL (ref 0.60–1.30)
ESTIMATED GFR: 112 mL/min/1.73mˆ2 (ref >59)
GLOBULIN: 2.6 (ref 2.0–3.5)
GLUCOSE: 100 mg/dL (ref 74–109)
OSMOLALITY, CALCULATED: 285 mosm/kg (ref 270–290)
POTASSIUM: 4 mmol/L (ref 3.5–5.1)
PROTEIN TOTAL: 7 g/dL (ref 6.4–8.9)
SODIUM: 142 mmol/L (ref 136–145)

## 2024-02-13 LAB — PSA SCREENING: PSA: 0.89 ng/mL (ref <=4.00)

## 2024-02-13 MED ORDER — OMEPRAZOLE 20 MG CAPSULE,DELAYED RELEASE: 20.0000 mg | DELAYED_RELEASE_CAPSULE | Freq: Every day | ORAL | 1 refills | Status: AC

## 2024-02-13 MED ORDER — CHOLECALCIFEROL (VITAMIN D3) 1,250 MCG (50,000 UNIT) CAPSULE: 50000.0000 [IU] | ORAL_CAPSULE | ORAL | 1 refills | Status: AC

## 2024-02-13 MED ORDER — ALBUTEROL SULFATE HFA 90 MCG/ACTUATION AEROSOL INHALER: 1.0000 | INHALATION_SPRAY | Freq: Four times a day (QID) | RESPIRATORY_TRACT | 1 refills | Status: AC | PRN

## 2024-02-13 MED ORDER — NURTEC ODT 75 MG DISINTEGRATING TABLET: 75.0000 mg | ORAL_TABLET | ORAL | 2 refills | Status: AC | PRN

## 2024-02-13 MED ORDER — TESTOSTERONE 1 % (25 MG/2.5 GRAM) TRANSDERMAL GEL PACKET: Freq: Every day | TRANSDERMAL | 2 refills | Status: AC

## 2024-02-13 NOTE — Assessment & Plan Note (Addendum)
 Orders:    cholecalciferol , vitamin D3, 1,250 mcg (50,000 unit) Oral Capsule; Take 1 Capsule (50,000 Units total) by mouth Every 7 days for 180 days

## 2024-02-13 NOTE — Assessment & Plan Note (Addendum)
 Orders:    rimegepant (NURTEC ODT ) 75 mg Oral Tablet, Rapid Dissolve; Place 1 Tablet (75 mg total) under the tongue Every 48 hours as needed for Migraine

## 2024-02-13 NOTE — Nursing Note (Signed)
 02/13/24 0808   PHQ 9 (follow up)   Little interest or pleasure in doing things. 0   Feeling down, depressed, or hopeless 0   Trouble falling or staying asleep, or sleeping too much. 3   Feeling tired or having little energy 3   Poor appetite or overeating 3   Feeling bad about yourself/ that you are a failure in the past 2 weeks? 0   Trouble concentrating on things in the past 2 weeks? 1   Moving/Speaking slowly or being fidgety or restless  in the past 2 weeks? 1   Thoughts that you would be better off DEAD, or of hurting yourself in some way. 0   If you checked off any problems, how difficult have these problems made it for you to do your work, take care of things at home, or get along with other people? Somewhat difficult   PHQ 9 Total 11   Interpretation of Total Score Moderate depression

## 2024-02-13 NOTE — Assessment & Plan Note (Addendum)
 Orders:    omeprazole  (PRILOSEC) 20 mg Oral Capsule, Delayed Release(E.C.); Take 1 Capsule (20 mg total) by mouth Once a day OTC    CBC/DIFF; Future    COMPREHENSIVE METABOLIC PNL, FASTING; Future

## 2024-02-13 NOTE — Assessment & Plan Note (Addendum)
 Orders:    TESTOSTERONE  FREE (DIALYSIS) AND TOTAL,MS; Future    Testosterone  1 % (25 mg/2.5gram) Transdermal Gel in Packet; Place on the skin Daily Apply to armpits, shoulders, or groin. Use applicator. Strict handwashing with soap and water after application.

## 2024-02-13 NOTE — Progress Notes (Signed)
 [facility] MED[facility] OFF[facility]  FAMILY MEDICINE, MEDICAL OFFICE BUILDING  [address]  [place] [address]  [phone]   Aaron Skinner  [birth year]  [id number]    Date of Service: 02/13/2024  Chief complaint:   Chief Complaint   Patient presents with    Follow Up 3 Months     Subjective:   Aaron Skinner is a 44 y.o. male and presents today for follow-up on chronic disease management.   Headaches - no migraines since starting Nurtec. He is tolerating well.   Obesity - did not tolerate semaglutide. Weight is up 11 lbs since last visit.   GERD - on Prilosec 20 mg. Back to baseline.   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 Asks about trying topical gel.   BPH - Does note some urinary dribbling. No hesitancy. Good stream. Nocturia x 2-3 episodes. Asks to check PSA.   Denies chest pain, SOB, heart flutters.   He notes hx of childhood asthma and reactive coughing since he had covid. He asks about having a rescue inhaler.     Past Medical History:   Diagnosis Date    Allergic rhinitis     Anxiety     COVID     Esophageal reflux     Excessive daytime sleepiness     Hypertension     does not have anymore    Tachycardia, unspecified          Past Surgical History:   Procedure Laterality Date    DENTAL SURGERY      HX WISDOM TEETH EXTRACTION           Current Outpatient Medications   Medication Sig Dispense Refill    albuterol  sulfate (PROVENTIL  OR VENTOLIN  OR PROAIR ) 90 mcg/actuation Inhalation oral inhaler Take 1-2 Puffs by inhalation Every 6 hours as needed 1 Each 1    cholecalciferol , vitamin D3, 1,250 mcg (50,000 unit) Oral Capsule Take 1 Capsule (50,000 Units total) by mouth Every 7 days for 180 days 12 Capsule 1    multivitamin with iron Oral Tablet Take 1 Tablet by mouth Once a day      omeprazole  (PRILOSEC) 20 mg Oral Capsule, Delayed Release(E.C.) Take 1 Capsule (20 mg total) by mouth Once a day OTC 90 Capsule 1    rimegepant (NURTEC ODT ) 75 mg Oral Tablet, Rapid Dissolve Place 1  Tablet (75 mg total) under the tongue Every 48 hours as needed for Migraine 16 Tablet 2    Testosterone  1 % (25 mg/2.5gram) Transdermal Gel in Packet Place on the skin Daily Apply to armpits, shoulders, or groin. Use applicator. Strict handwashing with soap and water after application. 75 g 2     No current facility-administered medications for this visit.     Allergies[1]  Review of Systems:  Any pertinent Review of Systems as addressed in the HPI above.    Objective:   BP 119/88   Pulse 75   Temp 36.7 C (98.1 F) (Temporal)   Resp 20   Ht 1.753 m (5' 9)   Wt (!) 141 kg (310 lb)   SpO2 95%   BMI 45.78 kg/m       BMI addressed: Advised on diet, weight loss, and exercise to reduce above normal BMI.       Constitutional: No acute distress. Alert and oriented.  Head: Normocephalic, atraumatic.  Eyes: Clear. PERRL. EOMI.  Ears: Canals and TM's intact bilaterally. Canals with cerumen bilaterally.   Nose:  Patent. No rhinorrhea.  Throat: Clear, no erythema or drainge.  Lungs: Clear to auscultation bilaterally. No wheezes, rhonchi, or rales.  Heart: Regular rate and rhythm. No murmurs.  Abdomen: Soft, non-tender, nondistended. BS active.  Extremities: Intact x 4. No edema. Gait stable.  Neuro: CN 2-12 grossly intact.   Psych: Mood and affect appropriate and congruent.    Laboratory:  COMPLETE BLOOD COUNT   Lab Results   Component Value Date    WBC 6.7 11/07/2023    HGB 15.9 11/07/2023    HCT 44.5 11/07/2023    PLTCNT 276 11/07/2023       DIFFERENTIAL  Lab Results   Component Value Date    PMNS 65 11/07/2023    LYMPHOCYTES 23 11/07/2023    MONOCYTES 10 11/07/2023    EOSINOPHIL 2 11/07/2023    BASOPHILS 1 11/07/2023    BASOPHILS 0.00 11/07/2023    PMNABS 4.30 11/07/2023    LYMPHSABS 1.50 11/07/2023    EOSABS 0.10 11/07/2023    MONOSABS 0.60 11/07/2023     BASIC METABOLIC PANEL  Lab Results   Component Value Date    SODIUM 139 09/21/2023    POTASSIUM 4.1 09/21/2023    CHLORIDE 106 09/21/2023    CO2 25 09/21/2023     ANIONGAP 8 09/21/2023    BUN 14 09/21/2023    CREATININE 0.99 09/21/2023    BUNCRRATIO 14 09/21/2023    GFR 98 09/21/2023    CALCIUM 9.6 09/21/2023    GLUCOSENF 99 09/21/2023      THYROID  STIMULATING HORMONE  Lab Results   Component Value Date    TSH 1.280 09/21/2023        Most recent lab results reviewed with patient.    Assessment & Plan  Vitamin D  deficiency    Orders:    cholecalciferol , vitamin D3, 1,250 mcg (50,000 unit) Oral Capsule; Take 1 Capsule (50,000 Units total) by mouth Every 7 days for 180 days    Gastroesophageal reflux disease, unspecified whether esophagitis present    Orders:    omeprazole  (PRILOSEC) 20 mg Oral Capsule, Delayed Release(E.C.); Take 1 Capsule (20 mg total) by mouth Once a day OTC    CBC/DIFF; Future    COMPREHENSIVE METABOLIC PNL, FASTING; Future    Migraine    Orders:    rimegepant (NURTEC ODT ) 75 mg Oral Tablet, Rapid Dissolve; Place 1 Tablet (75 mg total) under the tongue Every 48 hours as needed for Migraine    Male hypogonadism    Orders:    TESTOSTERONE  FREE (DIALYSIS) AND TOTAL,MS; Future    Testosterone  1 % (25 mg/2.5gram) Transdermal Gel in Packet; Place on the skin Daily Apply to armpits, shoulders, or groin. Use applicator. Strict handwashing with soap and water after application.    Screening PSA (prostate specific antigen)    Orders:    PSA SCREENING; Future    Other orders    albuterol  sulfate (PROVENTIL  OR VENTOLIN  OR PROAIR ) 90 mcg/actuation Inhalation oral inhaler; Take 1-2 Puffs by inhalation Every 6 hours as needed      Counseling with regards to preventative care given. Medication summary was discussed with the patient to verify compliance and understanding. Medication safety was discussed. A good faith effort was made to reconcile the patient's medications.       The patient was given the opportunity to ask questions and those questions were answered to the patient's satisfaction. The patient was encouraged to call with any additional questions or concerns.  More than 50% of the  visit was spent counseling and coordinating care.    Orders Placed This Encounter    CBC/DIFF    COMPREHENSIVE METABOLIC PNL, FASTING    PSA SCREENING    TESTOSTERONE  FREE (DIALYSIS) AND TOTAL,MS    CBC WITH DIFF    cholecalciferol , vitamin D3, 1,250 mcg (50,000 unit) Oral Capsule    omeprazole  (PRILOSEC) 20 mg Oral Capsule, Delayed Release(E.C.)    rimegepant (NURTEC ODT ) 75 mg Oral Tablet, Rapid Dissolve    albuterol  sulfate (PROVENTIL  OR VENTOLIN  OR PROAIR ) 90 mcg/actuation Inhalation oral inhaler    Testosterone  1 % (25 mg/2.5gram) Transdermal Gel in Packet                  Return in about 3 months (around 05/13/2024).  Derrek Patricia, PA-C 02/13/2024, 08:40         [1]   Allergies  Allergen Reactions    Clindamycin GI Bleed     Other Reaction(s): Other (see comments)    Stomach ulcers

## 2024-02-13 NOTE — Nursing Note (Signed)
 02/13/24 9191   Domestic Violence   Because we are aware of abuse and domestic violence today, we ask all patients: Are you being hurt, hit, or frightened by anyone at your home or in your life?  N   Basic Needs   Do you have any basic needs within your home that are not being met? (such as Food, Shelter, Museum/gallery Curator, paying for bills and/or medications) N

## 2024-02-14 ENCOUNTER — Ambulatory Visit (INDEPENDENT_AMBULATORY_CARE_PROVIDER_SITE_OTHER): Payer: Self-pay | Admitting: PHYSICIAN ASSISTANT

## 2024-02-14 ENCOUNTER — Other Ambulatory Visit (INDEPENDENT_AMBULATORY_CARE_PROVIDER_SITE_OTHER): Payer: Self-pay | Admitting: PHYSICIAN ASSISTANT

## 2024-02-14 DIAGNOSIS — R7989 Other specified abnormal findings of blood chemistry: Secondary | ICD-10-CM

## 2024-02-17 LAB — TESTOSTERONE FREE (DIALYSIS) AND TOTAL,MS
TESTOSTERONE, FREE: 32.2 pg/mL — ABNORMAL LOW (ref 35.0–155.0)
TESTOSTERONE,TOTAL,LC/MS/MS: 149 ng/dL — ABNORMAL LOW (ref 250–1100)

## 2024-05-14 ENCOUNTER — Ambulatory Visit (INDEPENDENT_AMBULATORY_CARE_PROVIDER_SITE_OTHER): Payer: Self-pay | Admitting: PHYSICIAN ASSISTANT
